# Patient Record
Sex: Female | Born: 1975
Health system: Southern US, Community
[De-identification: ages and names within clinical notes are randomized; demographics above are authoritative.]

## PROBLEM LIST (undated history)

## (undated) DIAGNOSIS — D649 Anemia, unspecified: Secondary | ICD-10-CM

## (undated) DIAGNOSIS — I10 Essential (primary) hypertension: Secondary | ICD-10-CM

## (undated) DIAGNOSIS — M1990 Unspecified osteoarthritis, unspecified site: Secondary | ICD-10-CM

## (undated) DIAGNOSIS — G43909 Migraine, unspecified, not intractable, without status migrainosus: Secondary | ICD-10-CM

## (undated) HISTORY — PX: TONSILLECTOMY: SUR1361

## (undated) HISTORY — PX: DENTAL SURGERY: SHX609

## (undated) HISTORY — PX: BREAST ENHANCEMENT SURGERY: SHX7

## (undated) HISTORY — PX: WISDOM TOOTH EXTRACTION: SHX21

---

## 1999-01-12 ENCOUNTER — Other Ambulatory Visit: Admission: RE | Admit: 1999-01-12 | Discharge: 1999-01-12 | Payer: Self-pay | Admitting: Obstetrics and Gynecology

## 1999-01-15 ENCOUNTER — Other Ambulatory Visit: Admission: RE | Admit: 1999-01-15 | Discharge: 1999-01-15 | Payer: Self-pay | Admitting: Obstetrics

## 2006-04-10 ENCOUNTER — Emergency Department (HOSPITAL_COMMUNITY): Admission: EM | Admit: 2006-04-10 | Discharge: 2006-04-10 | Payer: Self-pay | Admitting: Emergency Medicine

## 2006-04-13 ENCOUNTER — Ambulatory Visit (HOSPITAL_COMMUNITY): Admission: RE | Admit: 2006-04-13 | Discharge: 2006-04-13 | Payer: Self-pay | Admitting: Emergency Medicine

## 2006-07-14 ENCOUNTER — Emergency Department (HOSPITAL_COMMUNITY): Admission: EM | Admit: 2006-07-14 | Discharge: 2006-07-14 | Payer: Self-pay | Admitting: Emergency Medicine

## 2006-07-27 ENCOUNTER — Emergency Department (HOSPITAL_COMMUNITY): Admission: EM | Admit: 2006-07-27 | Discharge: 2006-07-27 | Payer: Self-pay | Admitting: Emergency Medicine

## 2006-11-09 ENCOUNTER — Emergency Department (HOSPITAL_COMMUNITY): Admission: EM | Admit: 2006-11-09 | Discharge: 2006-11-09 | Payer: Self-pay | Admitting: Emergency Medicine

## 2007-02-05 ENCOUNTER — Emergency Department (HOSPITAL_COMMUNITY): Admission: EM | Admit: 2007-02-05 | Discharge: 2007-02-05 | Payer: Self-pay | Admitting: Family Medicine

## 2007-09-13 ENCOUNTER — Emergency Department (HOSPITAL_COMMUNITY): Admission: EM | Admit: 2007-09-13 | Discharge: 2007-09-13 | Payer: Self-pay | Admitting: Emergency Medicine

## 2008-01-09 ENCOUNTER — Emergency Department (HOSPITAL_COMMUNITY): Admission: EM | Admit: 2008-01-09 | Discharge: 2008-01-09 | Payer: Self-pay | Admitting: Family Medicine

## 2008-04-05 ENCOUNTER — Emergency Department (HOSPITAL_BASED_OUTPATIENT_CLINIC_OR_DEPARTMENT_OTHER): Admission: EM | Admit: 2008-04-05 | Discharge: 2008-04-05 | Payer: Self-pay | Admitting: Emergency Medicine

## 2008-05-11 ENCOUNTER — Ambulatory Visit (HOSPITAL_BASED_OUTPATIENT_CLINIC_OR_DEPARTMENT_OTHER): Admission: RE | Admit: 2008-05-11 | Discharge: 2008-05-11 | Payer: Self-pay | Admitting: Obstetrics and Gynecology

## 2008-05-19 ENCOUNTER — Ambulatory Visit (HOSPITAL_BASED_OUTPATIENT_CLINIC_OR_DEPARTMENT_OTHER): Admission: RE | Admit: 2008-05-19 | Discharge: 2008-05-19 | Payer: Self-pay | Admitting: Obstetrics and Gynecology

## 2008-06-02 ENCOUNTER — Encounter (INDEPENDENT_AMBULATORY_CARE_PROVIDER_SITE_OTHER): Payer: Self-pay | Admitting: *Deleted

## 2008-06-08 LAB — CONVERTED CEMR LAB: Pap Smear: NORMAL

## 2008-09-05 ENCOUNTER — Ambulatory Visit: Payer: Self-pay | Admitting: Family Medicine

## 2008-09-05 DIAGNOSIS — L678 Other hair color and hair shaft abnormalities: Secondary | ICD-10-CM | POA: Insufficient documentation

## 2008-09-05 DIAGNOSIS — I10 Essential (primary) hypertension: Secondary | ICD-10-CM | POA: Insufficient documentation

## 2008-09-05 DIAGNOSIS — L738 Other specified follicular disorders: Secondary | ICD-10-CM | POA: Insufficient documentation

## 2008-10-07 ENCOUNTER — Ambulatory Visit: Payer: Self-pay | Admitting: *Deleted

## 2008-10-07 DIAGNOSIS — E7800 Pure hypercholesterolemia, unspecified: Secondary | ICD-10-CM | POA: Insufficient documentation

## 2008-10-07 DIAGNOSIS — D135 Benign neoplasm of extrahepatic bile ducts: Secondary | ICD-10-CM

## 2008-10-07 DIAGNOSIS — K219 Gastro-esophageal reflux disease without esophagitis: Secondary | ICD-10-CM | POA: Insufficient documentation

## 2008-10-07 DIAGNOSIS — M159 Polyosteoarthritis, unspecified: Secondary | ICD-10-CM | POA: Insufficient documentation

## 2008-10-07 DIAGNOSIS — R109 Unspecified abdominal pain: Secondary | ICD-10-CM | POA: Insufficient documentation

## 2008-10-07 DIAGNOSIS — D134 Benign neoplasm of liver: Secondary | ICD-10-CM | POA: Insufficient documentation

## 2008-10-07 DIAGNOSIS — IMO0001 Reserved for inherently not codable concepts without codable children: Secondary | ICD-10-CM | POA: Insufficient documentation

## 2008-10-07 DIAGNOSIS — G43009 Migraine without aura, not intractable, without status migrainosus: Secondary | ICD-10-CM | POA: Insufficient documentation

## 2008-10-07 DIAGNOSIS — K5909 Other constipation: Secondary | ICD-10-CM | POA: Insufficient documentation

## 2008-10-08 ENCOUNTER — Encounter (INDEPENDENT_AMBULATORY_CARE_PROVIDER_SITE_OTHER): Payer: Self-pay | Admitting: *Deleted

## 2008-10-17 ENCOUNTER — Ambulatory Visit: Payer: Self-pay | Admitting: *Deleted

## 2008-10-17 DIAGNOSIS — R17 Unspecified jaundice: Secondary | ICD-10-CM | POA: Insufficient documentation

## 2008-10-17 LAB — CONVERTED CEMR LAB
ALT: 15 units/L (ref 0–35)
AST: 17 units/L (ref 0–37)
Albumin: 4.2 g/dL (ref 3.5–5.2)
Alkaline Phosphatase: 64 units/L (ref 39–117)
Amylase: 111 units/L (ref 27–131)
BUN: 13 mg/dL (ref 6–23)
Basophils Absolute: 0.1 10*3/uL (ref 0.0–0.1)
Basophils Relative: 1.1 % (ref 0.0–3.0)
CO2: 31 meq/L (ref 19–32)
Calcium: 9 mg/dL (ref 8.4–10.5)
Chloride: 101 meq/L (ref 96–112)
Creatinine, Ser: 0.7 mg/dL (ref 0.4–1.2)
Eosinophils Absolute: 0.2 10*3/uL (ref 0.0–0.7)
Eosinophils Relative: 3.6 % (ref 0.0–5.0)
GFR calc Af Amer: 124 mL/min
GFR calc non Af Amer: 102 mL/min
Glucose, Bld: 74 mg/dL (ref 70–99)
HCT: 37.9 % (ref 36.0–46.0)
Hemoglobin: 12.9 g/dL (ref 12.0–15.0)
Lipase: 14 units/L (ref 11.0–59.0)
Lymphocytes Relative: 41.8 % (ref 12.0–46.0)
MCHC: 34 g/dL (ref 30.0–36.0)
MCV: 96 fL (ref 78.0–100.0)
Monocytes Absolute: 0.4 10*3/uL (ref 0.1–1.0)
Monocytes Relative: 7.3 % (ref 3.0–12.0)
Neutro Abs: 2.3 10*3/uL (ref 1.4–7.7)
Neutrophils Relative %: 46.2 % (ref 43.0–77.0)
Platelets: 238 10*3/uL (ref 150–400)
Potassium: 4.1 meq/L (ref 3.5–5.1)
RBC: 3.95 M/uL (ref 3.87–5.11)
RDW: 12 % (ref 11.5–14.6)
Sodium: 138 meq/L (ref 135–145)
TSH: 1.31 microintl units/mL (ref 0.35–5.50)
Total Bilirubin: 1.4 mg/dL — ABNORMAL HIGH (ref 0.3–1.2)
Total Protein: 6.9 g/dL (ref 6.0–8.3)
WBC: 5.1 10*3/uL (ref 4.5–10.5)

## 2008-10-28 ENCOUNTER — Ambulatory Visit: Payer: Self-pay | Admitting: *Deleted

## 2008-10-28 DIAGNOSIS — L02219 Cutaneous abscess of trunk, unspecified: Secondary | ICD-10-CM | POA: Insufficient documentation

## 2008-10-28 DIAGNOSIS — B354 Tinea corporis: Secondary | ICD-10-CM | POA: Insufficient documentation

## 2008-10-28 DIAGNOSIS — L03319 Cellulitis of trunk, unspecified: Secondary | ICD-10-CM

## 2008-11-30 ENCOUNTER — Encounter (INDEPENDENT_AMBULATORY_CARE_PROVIDER_SITE_OTHER): Payer: Self-pay | Admitting: *Deleted

## 2008-12-02 ENCOUNTER — Ambulatory Visit: Payer: Self-pay | Admitting: Diagnostic Radiology

## 2008-12-02 ENCOUNTER — Ambulatory Visit (HOSPITAL_BASED_OUTPATIENT_CLINIC_OR_DEPARTMENT_OTHER): Admission: RE | Admit: 2008-12-02 | Discharge: 2008-12-02 | Payer: Self-pay | Admitting: Gastroenterology

## 2008-12-12 ENCOUNTER — Telehealth (INDEPENDENT_AMBULATORY_CARE_PROVIDER_SITE_OTHER): Payer: Self-pay | Admitting: *Deleted

## 2009-03-23 ENCOUNTER — Ambulatory Visit: Payer: Self-pay | Admitting: Family Medicine

## 2010-05-04 ENCOUNTER — Encounter: Payer: Self-pay | Admitting: Family

## 2010-05-07 ENCOUNTER — Encounter: Payer: Self-pay | Admitting: Family

## 2010-05-07 ENCOUNTER — Telehealth: Payer: Self-pay | Admitting: Family

## 2010-05-09 ENCOUNTER — Ambulatory Visit: Payer: Self-pay | Admitting: Diagnostic Radiology

## 2010-05-09 ENCOUNTER — Ambulatory Visit (HOSPITAL_BASED_OUTPATIENT_CLINIC_OR_DEPARTMENT_OTHER): Admission: RE | Admit: 2010-05-09 | Discharge: 2010-05-09 | Payer: Self-pay | Admitting: Gastroenterology

## 2010-05-09 ENCOUNTER — Encounter: Payer: Self-pay | Admitting: Family

## 2010-05-11 IMAGING — CT CT HEAD W/O CM
1 series · 16 of 30 positions shown, 20 images · non-contrast
Comparison: Head CT 04/13/2006.

CLINICAL DATA: Headache.

CT HEAD WITHOUT CONTRAST
TECHNIQUE: Contiguous axial images were obtained from the base of
the skull through the vertex without contrast.

[Series 2: head 4.8 h37s · axial · 0.40mm/px · z∈[+1246,+1379]mm · 16 of 32 slices shown, 20 images]
[im 2/32  brain]
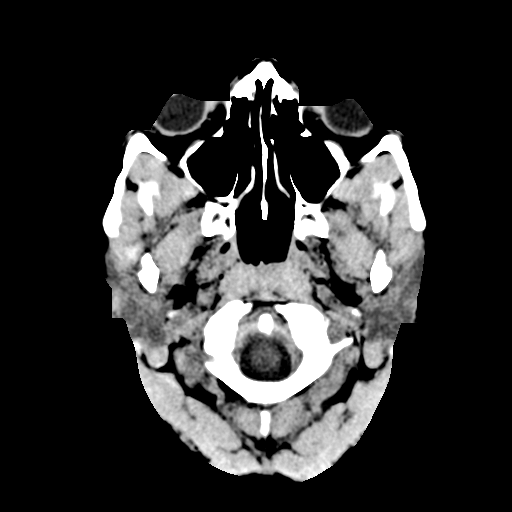
[im 2/32  bone]
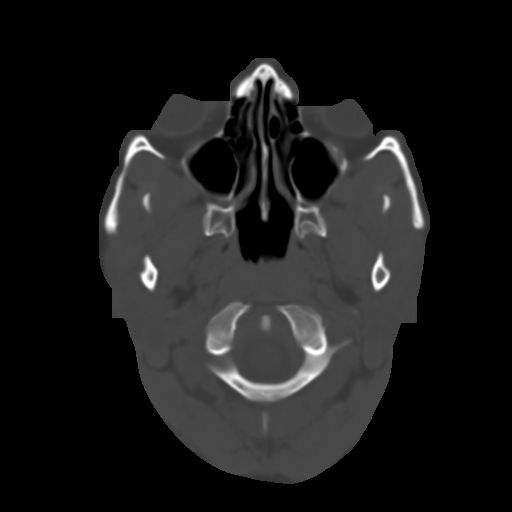
[im 4/32  brain]
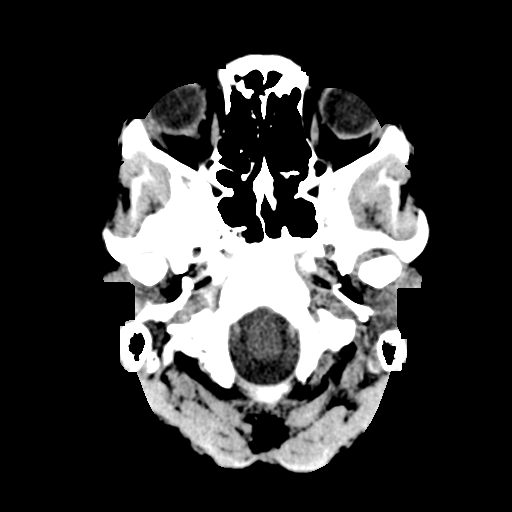
[im 6/32  brain]
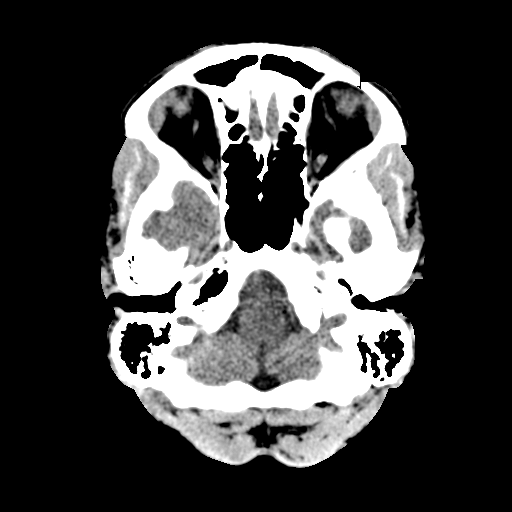
[im 8/32  brain]
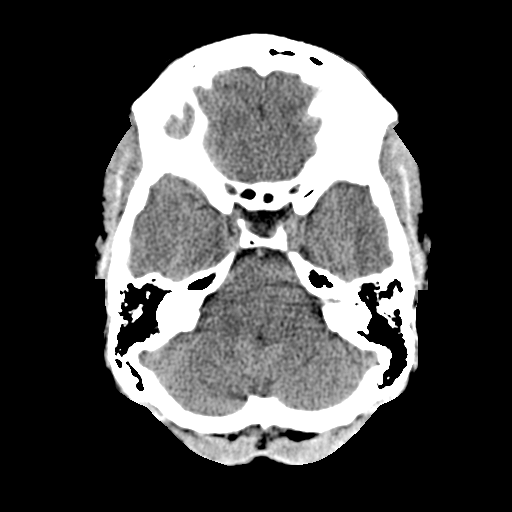
[im 9/32  brain]
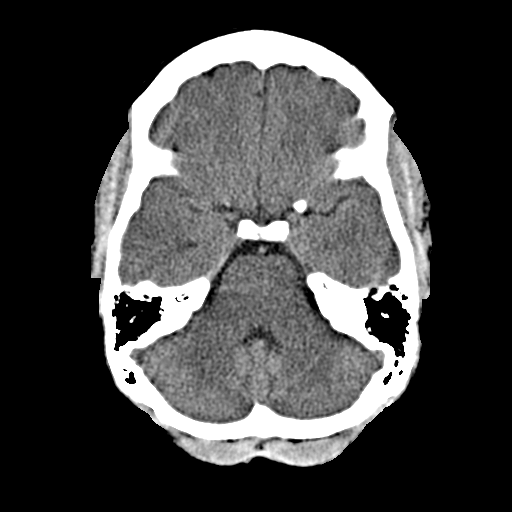
[im 9/32  bone]
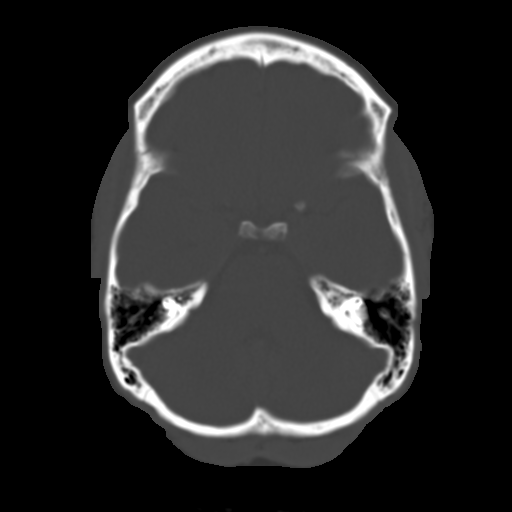
[im 11/32  brain]
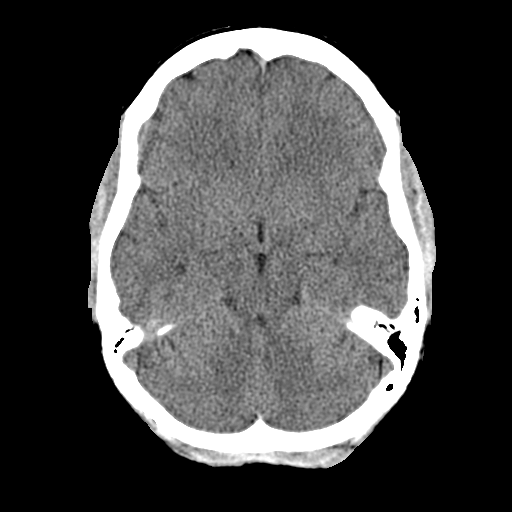
[im 13/32  brain]
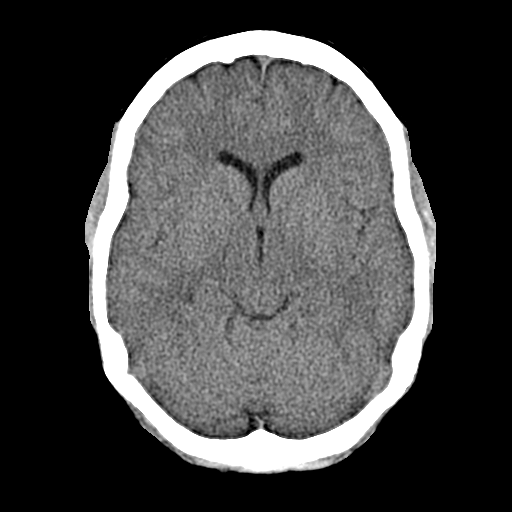
[im 15/32  brain]
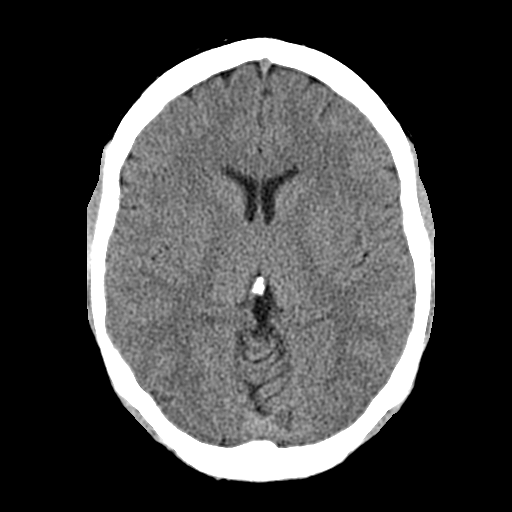
[im 17/32  brain]
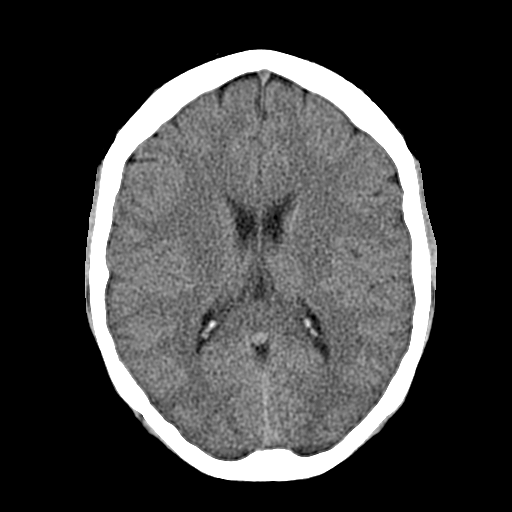
[im 17/32  bone]
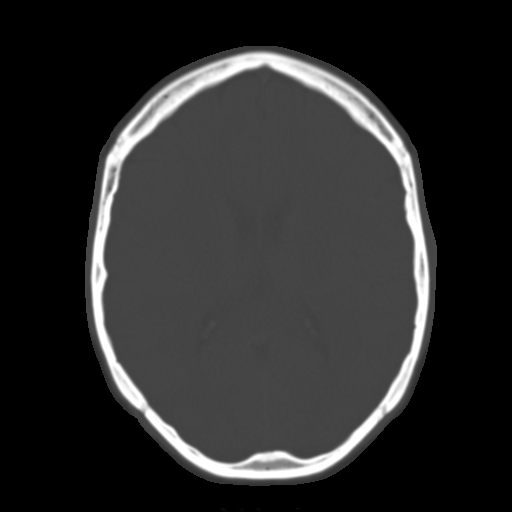
[im 19/32  brain]
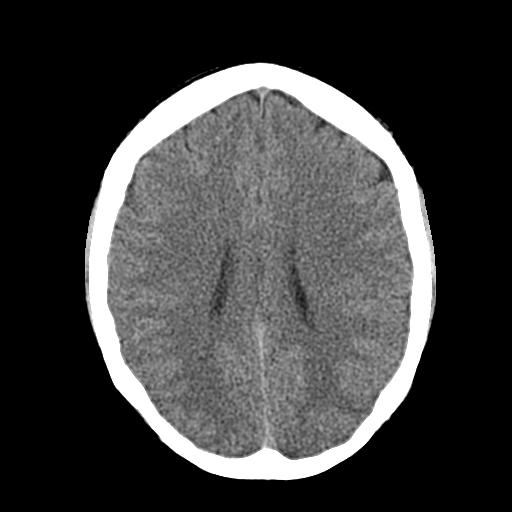
[im 21/32  brain]
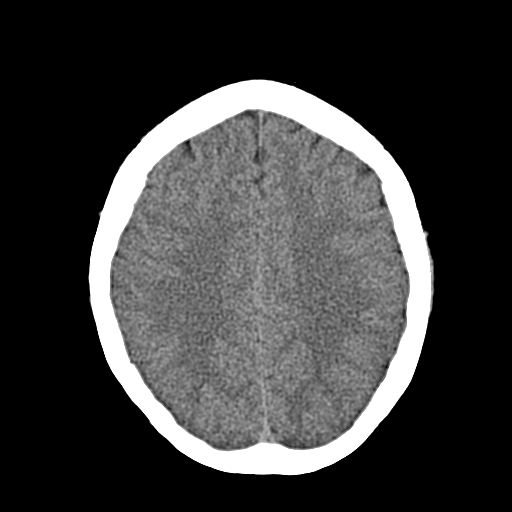
[im 23/32  brain]
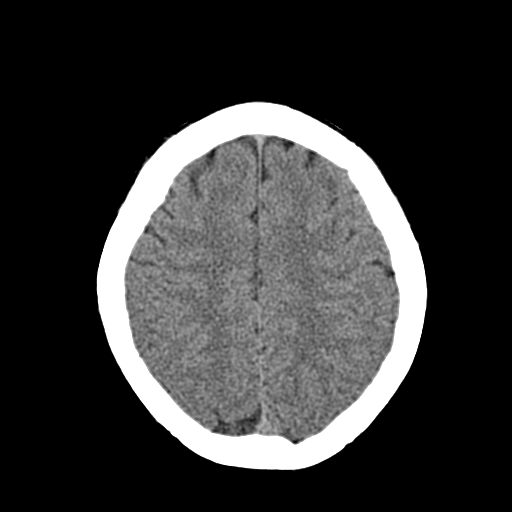
[im 24/32  brain]
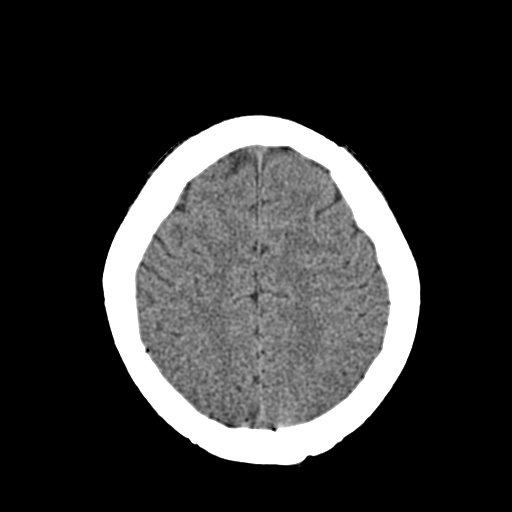
[im 24/32  bone]
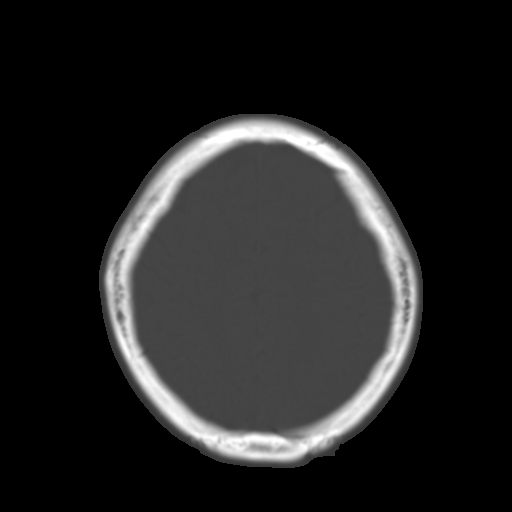
[im 26/32  brain]
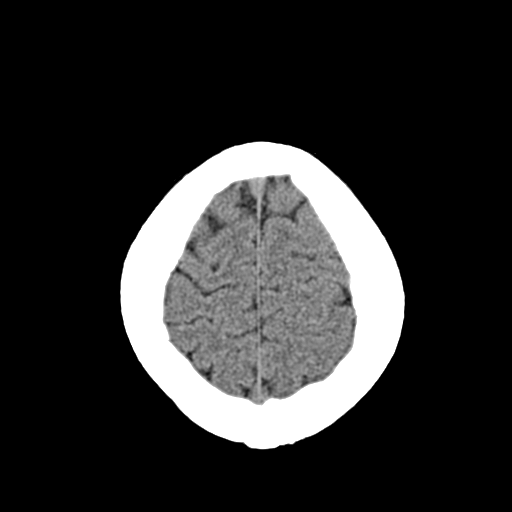
[im 28/32  brain]
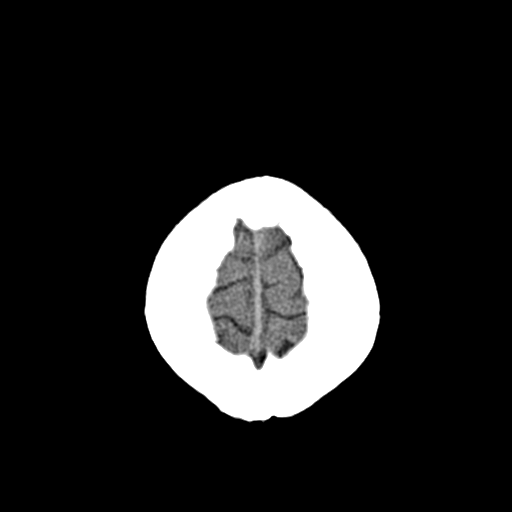
[im 30/32  brain]
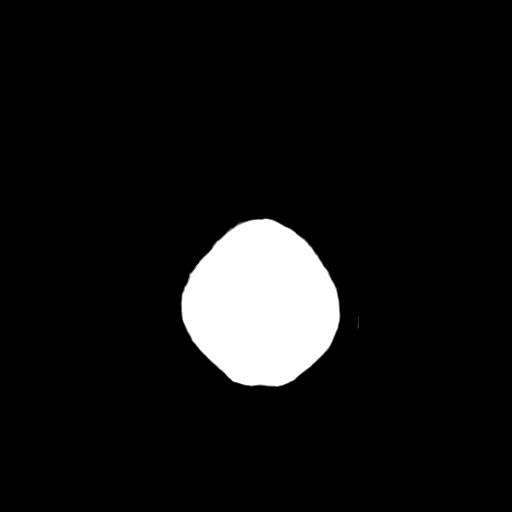

[16 of 30 positions shown; findings below may reference images not displayed]

FINDINGS: The brain appears normal without evidence of hemorrhage,
infarct, mass, mass effect, midline shift or abnormal extra-axial
fluid collection.  There is no hydrocephalus.  Imaged paranasal
sinuses demonstrate partial visualization of a polyp or cyst in the
left maxillary sinus.  Paranasal sinuses and mastoid air cells are
otherwise clear.  Calvarium is intact.
IMPRESSION: 1.  No acute intracranial abnormality.
2.  Partial visualization of a polyp or mucous retention cyst in
the left maxillary sinus.

## 2010-07-29 ENCOUNTER — Ambulatory Visit: Payer: Self-pay | Admitting: Emergency Medicine

## 2010-10-01 ENCOUNTER — Encounter: Payer: Self-pay | Admitting: Obstetrics and Gynecology

## 2010-10-11 NOTE — Letter (Signed)
Summary: [facility]  [facility]   Imported By: Lanelle Bal 05/11/2010 11:56:06  _____________________________________________________________________  External Attachment:    Type:   Image     Comment:   External Document

## 2010-10-11 NOTE — Progress Notes (Signed)
Summary: Needs appt., letter mailed  Phone Note Outgoing Call   Summary of Call: Please call patient and let her know that I reviewed consultation from Dr. Matthias Hughs.  She is due for follow up in our office.  Initial call taken by: Lemont Fillers FNP,  May 07, 2010 10:11 AM  Follow-up for Phone Call        Pt home number no longer in service, not listed at work number.  Mailed contact letter to pt to call and schedule an appt.  Pt will need . appt. Nicki Guadalajara Fergerson CMA Duncan Dull)  May 07, 2010 12:11 PM

## 2010-10-11 NOTE — Assessment & Plan Note (Signed)
Summary: SORE THROAT & SINUS/KH   Vital Signs:  Patient Profile:   35 Years Old Female CC:      Sore throat, sinus drainage, sneezing x 1 day Height:     61.5 inches Weight:      145 pounds O2 Sat:      98 % O2 treatment:    Room Air Temp:     98 degrees F oral Pulse rate:   60 / minute Pulse rhythm:   regular Resp:     12 per minute BP sitting:   117 / 78  (right arm) Cuff size:   regular                  Current Allergies: ! * METRONIDAZOLE ! FLAGYL History of Present Illness Chief Complaint: Sore throat, sinus drainage, sneezing x 1 day History of Present Illness: ONSET TODAY WITH SCRATCHY THROAT. NO FEVER. NO EAR PAIN. HAS SINUS PRESSURE. STATES IT IS COMING ON FAST.  Current Meds NADOLOL 40 MG TABS (NADOLOL) one a day * VITAMIN A 10,000 once a day * MULTIVITAMIN once a day * VITAMIN C 500  MG once a day SUMATRIPTAN SUCCINATE 100 MG TABS (SUMATRIPTAN SUCCINATE) 1 tab at onset, may repeat x 1 if no relief in 24 hours, max of 2 / 24hours * AMOXICILLIN 1 TAB three times a day * RONDEC DM 1-2 TSP by mouth Q 6 HRS as needed COUGH AND CONGESTION  REVIEW OF SYSTEMS Constitutional Symptoms      Denies fever, chills, night sweats, weight loss, weight gain, and fatigue.  Eyes       Denies change in vision, eye pain, eye discharge, glasses, contact lenses, and eye surgery. Ear/Nose/Throat/Mouth       Complains of sinus problems and sore throat.      Denies hearing loss/aids, change in hearing, ear pain, ear discharge, dizziness, frequent runny nose, frequent nose bleeds, hoarseness, and tooth pain or bleeding.  Respiratory       Denies dry cough, productive cough, wheezing, shortness of breath, asthma, bronchitis, and emphysema/COPD.  Cardiovascular       Denies murmurs, chest pain, and tires easily with exhertion.    Gastrointestinal       Denies stomach pain, nausea/vomiting, diarrhea, constipation, blood in bowel movements, and indigestion. Genitourniary       Denies  painful urination, kidney stones, and loss of urinary control. Neurological       Denies paralysis, seizures, and fainting/blackouts. Musculoskeletal       Denies muscle pain, joint pain, joint stiffness, decreased range of motion, redness, swelling, muscle weakness, and gout.  Skin       Denies bruising, unusual mles/lumps or sores, and hair/skin or nail changes.  Psych       Denies mood changes, temper/anger issues, anxiety/stress, speech problems, depression, and sleep problems.  Past History:  Past Medical History: Reviewed history from 10/17/2008 and no changes required. arthritis migraines - neurologist fibromyalgia (took lyrica for one month, but no further symptoms after that) Hypertension (and white coat HTN - BP better when checked outside of office) GERD chronic, intermittent constipation benign lesion - liver chronic, intermittent abdominal pain since 04/2008 hyperlipidemia gilbert's syndrome  Past Surgical History: Reviewed history from 09/05/2008 and no changes required. tonsillectomy 2/98 wisdom teeth removed 3/03  Family History: Reviewed history from 09/05/2008 and no changes required. mother, father, 1 brother alive and healthy  Social History: Reviewed history from 09/05/2008 and no changes required. Never Smoked Alcohol use-no  Drug use-no Regular exercise-no Physical Exam General appearance: well developed, well nourished, no acute distress Eyes: conjunctivae and lids normal Ears: normal, no lesions or deformities Nasal: CONGESTION, SUINUS PRESSURE Oral/Pharynx: UVULA AND SOFT PALAT SWOLLEN AND RED. Neck: neck supple,  trachea midline, no masses Chest/Lungs: no rales, wheezes, or rhonchi bilateral, breath sounds equal without effort Heart: regular rate and  rhythm, no murmur Skin: no obvious rashes or lesions Assessment New Problems: UPPER RESPIRATORY INFECTION, ACUTE (ICD-465.9)   Plan New Medications/Changes: RONDEC DM 1-2 TSP by mouth Q 6  HRS as needed COUGH AND CONGESTION  #4 OZ x 0, 03/23/2009, [name] DO AMOXICILLIN 1 TAB three times a day  #30 x 0, 03/23/2009, [name] DO  New Orders: Est. Patient Level II [81191]   Prescriptions: RONDEC DM 1-2 TSP by mouth Q 6 HRS as needed COUGH AND CONGESTION  #4 OZ x 0   Entered and Authorized by:   [name] DO   Signed by:   [name] DO on 03/23/2009   Method used:   Print then Give to Patient   RxID:   [id number] AMOXICILLIN 1 TAB three times a day  #30 x 0   Entered and Authorized by:   [name] DO   Signed by:   [name] DO on 03/23/2009   Method used:   Print then Give to Patient   RxID:   [id number]   Patient Instructions: 1)  TYLENOL OR MOTRIN AS NEEDED. AVOID CAFFEINE AND MILK PRODUCTS. FOLLOW UP WITH PCP IF SYMPTOMS FAIN TO RESOLVE ORE WORSEN.

## 2010-10-11 NOTE — Assessment & Plan Note (Signed)
Summary: NEW PT TO EST/HEA   Vital Signs:  Patient Profile:   35 Years Old Female LMP:     09/27/2008 Height:     61.5 inches Weight:      145 pounds BMI:     27.05 O2 treatment:    Room Air Temp:     98.3 degrees F oral Pulse rate:   68 / minute Pulse rhythm:   regular Resp:     18 per minute BP sitting:   120 / 90  (right arm) Cuff size:   regular  Vitals Entered By: Darra Lis RMA (October 07, 2008 10:39 AM)  Menstrual History: LMP (date): 09/27/2008 LMP - Character: normal Menarche: 11 Menses interval: 28 days Menstrual flow: 4-5 days             Is Patient Diabetic? No     Visit Type:  New Patient - PE PCP:  Paulo Fruit MD  Chief Complaint:  Establish care with new doctor.Marland Kitchen  History of Present Illness: Patient would like a complete physical, labs and referral to GI MD  patient with a history of chronic, intermittent abdominal pain since 05/2008 - she has been diagnosis with GERD - now controlled with behavioral changes - she has a history of chronic, intermittent constipaiton - doing well at current time - she has a history of some blood in the stool several months ago - but was told it was her hemorrhoids - also with a history of a "benign lesion" found in her liver on MRI - those studies / records not available today.  Patient does not have any abdominal pain today, but would like to see GI to get to the bottom of these intermittent symptoms.   Patient would also like an HIV test done today.  She had full STD testing done in 05/2008 with gyn that was all negative, but would like the HIV repeated.  patient is married . . . but is concerned she might have been exposed.  full discussion given.  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  Patient with a history of migraines - has seen neuro in the past - no history of increased  intensity / frequency.  needs refill on imitrex.    Updated Prior Medication List: NADOLOL 40 MG TABS (NADOLOL) one a day * VITAMIN A 10,000 once a day * MULTIVITAMIN once a day * VITAMIN C 500  MG once a day SUMATRIPTAN SUCCINATE 100 MG TABS (SUMATRIPTAN SUCCINATE) 1 tab at onset, may repeat x 1 if no relief in 24 hours, max of 2 / 24hours  Current Allergies (reviewed today): ! * METRONIDAZOLE ! FLAGYL  Past Medical History:    arthritis    migraines - neurologist    fibromyalgia (took lyrica for one month, but no further symptoms after that)    Hypertension    GERD    chronic, intermittent constipation    benign lesion - liver    chronic, intermittent abdominal pain since 04/2008    hyperlipidemia    Risk Factors:  Passive smoke exposure:  yes Caffeine use:  3 - patient advised to cut back drinks per day Alcohol use:  yes    Type:  wine    Has patient --       Felt need to cut down:  no       Been annoyed  by complaints:  no       Felt guilty about drinking:  no       Needed eye opener in the morning:  no    Comments:  2 to 3 times a year    Counseled to quit/cut down alcohol use:  no Seatbelt use:  100 % Sun Exposure:  rarely  Family History Risk Factors:    Family History of MI in females < 68 years old:  no    Family History of MI in males < 43 years old:  no  PAP Smear History:     Date of Last PAP Smear:  06/08/2008    Results:  Normal   Mammogram History:     Date of Last Mammogram:  09/04/2006    Results:  Normal Bilateral    Review of Systems  The patient denies anorexia, fever, weight loss, weight gain, vision loss, decreased hearing, hoarseness, chest pain, syncope, dyspnea on exertion, peripheral edema, prolonged cough, hemoptysis, hematuria, genital sores, muscle weakness, suspicious skin lesions, transient blindness, difficulty walking, depression, unusual weight change, abnormal bleeding, enlarged lymph nodes, angioedema, and breast masses.      Physical Exam  General:     Well-developed, well nourished, well hydrated, in no acute distress; appropriate and cooperative   Head:     Normocephalic and atraumatic without obvious abnormalities.  Eyes:     No corneal or conjunctival inflammation. EOMI. PERRLA. Funduscopic exam benign, Vision grossly normal. Ears:     External ear shows no significant lesions or deformities.  Otoscopic examination reveals clear canals, tympanic membranes normal bilaterally. Hearing is grossly normal. Nose:     External nasal examination shows no deformity or inflammation. Nasal mucosa are pink and moist without lesions or exudates. Mouth:     Oral mucosa and oropharynx without lesions, erythema or exudates.  Teeth in good repair. no postnasal drip and no tongue abnormalities.   Neck:     supple, full ROM Chest Wall:     No deformities, masses, or tenderness noted. Lungs:     Normal respiratory effort, chest expands symmetrically with good air flow noted. Lungs clear to auscultation with no crackles, rales or wheezes.   Heart:     Normal rate and  rhythm. S1 and S2 normal, without gallop, murmur, click, rub  Abdomen:     Bowel sounds positive, abdomen soft and non-tender without masses, organomegaly or hernias noted. no distention  Msk:     No gross deformity noted of cervical, thoracic or lumbar spine. normal ROM. no muscle atrophy noted.   Extremities:     No clubbing, cyanosis, edema, or deformity noted, grossly normal full range of motion with all joints.   Neurologic:     alert & oriented X3,  gait normal.  no deficit in strength noted, no balance problems noted, neuro is grossly intact Skin:     Intact without suspicious lesions or rashes Psych:     Cognition and judgment appear intact. Alert and cooperative with normal attention span and concentration. No apparent delusions, illusions, hallucinations, normally interactive, good eye contact, not anxious or depressed appearing, and not  agitated.        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  will recheck BP at that appointment.  get labs Her updated  medication list for this problem includes:    Nadolol 40 Mg Tabs (Nadolol) ..... One a day  Orders: TLB-CMP (Comprehensive Metabolic Pnl) (80053-COMP) TLB-CBC Platelet - w/Differential (85025-CBCD) TLB-TSH (Thyroid Stimulating Hormone) (84443-TSH)   Problem # 2:  ABDOMINAL PAIN, CHRONIC (ICD-789.00) No pain today.  As per HPI, will get referral.  also some labs. Orders: Gastroenterology Referral (GI) TLB-Amylase (82150-AMYL) TLB-Lipase (83690-LIPASE)   Problem # 3:  GERD (ICD-530.81) patient has controlled the symptoms with behavioral / dietary changes per her report.  she will continue same and GI referral to review all GI diagnosis and intermittent abdominal pain Orders: Gastroenterology Referral (GI)   Problem # 4:  CONSTIPATION, CHRONIC (ICD-564.09) patient has controlled the symptoms with otc meds, behavioral / dietary changes per her report.  she will continue same and GI referral to review all GI diagnosis and intermittent abdominal pain Orders: Gastroenterology Referral (GI) TLB-TSH (Thyroid Stimulating Hormone) (84443-TSH)   Problem # 5:  COMMON MIGRAINE (ICD-346.10) will continue current meds Her updated medication list for this problem includes:    Nadolol 40 Mg Tabs (Nadolol) ..... One a day    Sumatriptan Succinate 100 Mg Tabs (Sumatriptan succinate) .Marland Kitchen... 1 tab at onset, may repeat x 1 if no relief in 24 hours, max of 2 / 24hours  Orders: TLB-CMP (Comprehensive Metabolic Pnl) (80053-COMP) TLB-CBC Platelet - w/Differential (85025-CBCD) TLB-TSH (Thyroid Stimulating Hormone) (84443-TSH)   Problem # 6:  CONTACT WITH OR EXPOSURE TO VENEREAL DISEASES  (ICD-V01.6) patient requests an HIV test.  She had full STD testing in 05/2008 when she was seen by gyn - all negative, but would like to have HIV repeated today.  patient is aware that these results cannot be given over the phone and she will return in one week to review results Orders: T-HIV Antibody  (Reflex) ([id number])   Problem # 7:  ENCOUNTER FOR LONG-TERM USE OF OTHER MEDICATIONS (ICD-V58.69)  Orders: TLB-CMP (Comprehensive Metabolic Pnl) (80053-COMP) TLB-CBC Platelet - w/Differential (85025-CBCD) TLB-TSH (Thyroid Stimulating Hormone) (84443-TSH) TLB-Amylase (82150-AMYL) TLB-Lipase (83690-LIPASE)   Complete Medication List: 1)  Nadolol 40 Mg Tabs (Nadolol) .... One a day 2)  Vitamin A 10,000  .... Once a day 3)  Multivitamin  .... Once a day 4)  Vitamin C 500 Mg  .... Once a day 5)  Sumatriptan Succinate 100 Mg Tabs (Sumatriptan succinate) .Marland Kitchen.. 1 tab at onset, may repeat x 1 if no relief in 24 hours, max of 2 / 24hours   Patient Instructions: 1)  Please schedule a follow-up appointment on 10/17/08 (or that week)   Prescriptions: SUMATRIPTAN SUCCINATE 100 MG TABS (SUMATRIPTAN SUCCINATE) 1 tab at onset, may repeat x 1 if no relief in 24 hours, max of 2 / 24hours  #12 x 1   Entered and Authorized by:   Paulo Fruit MD   Signed by:   Paulo Fruit MD on 10/07/2008   Method used:   Electronically to        [facility]* (retail)       [address]       [address] Shipping/mailing       [place], [place]  [address]       Ph: [phone]       Fax: [phone]   RxID:   [id number]   Appended Document: NEW PT TO EST/HEA charges added   Clinical Lists Changes  Orders: Added new Service order of New Patient Level IV (51884) - Signed

## 2010-10-11 NOTE — Consult Note (Signed)
Summary: [facility]  [facility]   Imported By: Lanelle Bal 12/07/2008 13:33:17  _____________________________________________________________________  External Attachment:    Type:   Image     Comment:   External Document

## 2010-10-11 NOTE — Letter (Signed)
Summary: [facility]  [facility]   Imported By: Lanelle Bal 10/20/2008 08:59:10  _____________________________________________________________________  External Attachment:    Type:   Image     Comment:   External Document

## 2010-10-11 NOTE — Assessment & Plan Note (Signed)
Summary: wound that is not healing   Vital Signs:  Patient Profile:   35 Years Old Female Height:     61.5 inches Weight:      145 pounds BMI:     27.05 O2 treatment:    Room Air Temp:     97.9 degrees F oral Pulse rate:   64 / minute Pulse rhythm:   regular Resp:     18 per minute BP sitting:   102 / 68  (right arm) Cuff size:   regular  Vitals Entered By: Darra Lis RMA (October 28, 2008 3:42 PM)                 Visit Type:  acute PCP:  Paulo Fruit MD  Chief Complaint:  area on left thigh that won't heal.  History of Present Illness: The patient reports that 2 months ago, she accidentally shaved off a "hair bump" in the left inguinal area that became infected.  She went to urgent care, was diagnosis with folliculitis and  was put on doxycyline and told to wash with benzoyl peroxide.  The area improved a lot, but never seemed to totally resolve.  She describes intermittent flares in the area where it will get tender, discolored and have some discharge.  There has never been a boil / abcess type lesion per patient report.  She just feels that it has never completely resolved.  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  Patient with a history of migraines - has seen neuro in the past - no history of increased intensity / frequency.  has follow up with neuro on 11/10/2008.  past history of borderline hyperlipidemia, never been on meds - had levels checked a few months ago - records not available - has follow up scheduled with me in June to recheck labs.  Patient with a history of  chronic, intermittent abdominal pain - was given a referral to GI at last appointment - that appointment is scheduled for 11/30/08.  no pain reported today.    Updated Prior Medication List: NADOLOL 40 MG TABS (NADOLOL) one a day * VITAMIN A 10,000 once a  day * MULTIVITAMIN once a day * VITAMIN C 500  MG once a day SUMATRIPTAN SUCCINATE 100 MG TABS (SUMATRIPTAN SUCCINATE) 1 tab at onset, may repeat x 1 if no relief in 24 hours, max of 2 / 24hours Current Allergies (reviewed today): ! * METRONIDAZOLE ! FLAGYL  Past Medical History:    Reviewed history from 10/17/2008 and no changes required:       arthritis       migraines - neurologist       fibromyalgia (took lyrica for one month, but no further symptoms after that)       Hypertension (and white coat HTN - BP better when checked outside of office)       GERD       chronic, intermittent constipation       benign lesion - liver       chronic, intermittent abdominal pain since 04/2008       hyperlipidemia       gilbert's syndrome  Past Surgical History:    Reviewed history from 09/05/2008 and no changes required:       tonsillectomy 2/98       wisdom teeth removed 3/03  Review of Systems       no nausea / vomiting / diarrhea / fever / chills / chest pain / SOB    Physical Exam  General:     Well-developed, well nourished, well hydrated, in no acute distress; appropriate and cooperative   Head:     Normocephalic and atraumatic without obvious abnormalities.  Eyes:     No corneal or conjunctival inflammation. EOMI. PERRLA. Vision grossly normal. Ears:     External ear shows no significant lesions or deformities.   Hearing is grossly normal. Nose:     External nasal examination shows no deformity or inflammation.   Neck:     supple, full ROM Lungs:     Normal respiratory effort, chest expands symmetrically with good air flow noted. Lungs clear to auscultation with no crackles, rales or wheezes.   Heart:     Normal rate and  rhythm. S1 and S2 normal, without gallop, murmur, click, rub  Extremities:     No clubbing, cyanosis, edema, or deformity noted, grossly normal full range of motion with all joints.   Neurologic:     alert & oriented X3,  gait normal.  no deficit  in strength noted, no balance problems noted, neuro is grossly intact Skin:     General: Intact without suspicious lesions or rashes anywhere except -  Specific exam of the left inguinal area shows a central area of erythema and excoriation with some drainage present about the size of a quarter - no fluctuance noted, no significant increase in warmth.  Surrounding this central area is an area of clearing, then a surrounding - annular scaling rash with a few scattered satellite lesions consistent with a fungal infection. Psych:     Cognition and judgment appear intact. Alert and cooperative with normal attention span and concentration. No apparent delusions, illusions, hallucinations, normally interactive, good eye contact, not anxious or depressed appearing, and not agitated.        Impression & Recommendations:  Problem # 1:  CELLULITIS, GROIN, LEFT (ICD-682.2) will treat with Bactrim to cover for possible MRSA.  Area also has some features consistent with fungal infection.  patient to stop shaving in the area.  she is to use clinical wash twice daily, air the area out and use antifungal cream.  Patient to call if symptoms increase / change / persist or any concerns.  Her updated medication list for this problem includes:    Sulfamethoxazole-tmp Ds 800-160 Mg Tabs (Sulfamethoxazole-trimethoprim) .Marland Kitchen... 1 tab by mouth two times a day x 10 days   Problem # 2:  DERMATOPHYTOSIS OF THE BODY (ICD-110.5) as above.  Problem # 3:  HYPERTENSION (ICD-401.9) continue current meds Her updated medication list for this problem includes:    Nadolol 40 Mg Tabs (Nadolol) ..... One a day   Problem # 4:  HYPERCHOLESTEROLEMIA (ICD-272.0) patient trying diet / exercise and will follow up with me in June - will get labs at that time  Problem # 5:  ABDOMINAL PAIN, CHRONIC (ICD-789.00) patient has appointment with GI MD next month.  no current complaints today.  Reviewed "red flag" symptoms that would require  immediate medical attention. Patient to call if symptoms increase / change / persist or any concerns.   Complete Medication List: 1)  Nadolol 40 Mg Tabs (Nadolol) .... One a day 2)  Vitamin A 10,000  .... Once a day 3)  Multivitamin  .... Once a day 4)  Vitamin C 500 Mg  .... Once a  day 5)  Sumatriptan Succinate 100 Mg Tabs (Sumatriptan succinate) .Marland Kitchen.. 1 tab at onset, may repeat x 1 if no relief in 24 hours, max of 2 / 24hours 6)  Nystatin-triamcinolone 100000-0.1 Unit/gm-% Crea (Nystatin-triamcinolone) .... Apply two times a day as needed until resolved, but no more than 2 weeks at one time 7)  Sulfamethoxazole-tmp Ds 800-160 Mg Tabs (Sulfamethoxazole-trimethoprim) .Marland Kitchen.. 1 tab by mouth two times a day x 10 days   Patient Instructions: 1)  patient has follow up with me in June with MD in the am / fasting.  NOT just a lab appt.    Prescriptions: SULFAMETHOXAZOLE-TMP DS 800-160 MG TABS (SULFAMETHOXAZOLE-TRIMETHOPRIM) 1 tab by mouth two times a day x 10 days  #20 x 1   Entered and Authorized by:   Paulo Fruit MD   Signed by:   Paulo Fruit MD on 10/28/2008   Method used:   Electronically to        [facility] # [phone]* (retail)       [address] [place], [place]  [address]       Ph: [phone]       Fax: [phone]   RxID:   [id number] NYSTATIN-TRIAMCINOLONE 100000-0.1 UNIT/GM-% CREA (NYSTATIN-TRIAMCINOLONE) apply two times a day as needed until resolved, but no more than 2 weeks at one time  #15 gm x 0   Entered and Authorized by:   Paulo Fruit MD   Signed by:   Paulo Fruit MD on 10/28/2008   Method used:   Electronically to        [facility] # [phone]* (retail)       [address] [place], [place]  [address]       Ph: [phone]       Fax: [phone]   RxID:   [id number]

## 2010-10-11 NOTE — Progress Notes (Signed)
Summary: Phone note  Phone Note Call from Patient   Caller: Patient Reason for Call: Talk to Doctor, Lab or Test Results Summary of Call: Day time [phone] or evening time [phone]. Pt. said that she had a ultra sound done 2 weeks ago and has not heard anything about results and would like for someone to call her and let her know something. Thank you, Michaelle Copas Initial call taken by: Michaelle Copas,  December 12, 2008 1:55 PM  Follow-up for Phone Call        Patient calling for U/S results. I do not see any results for this patient. Patient will call the doctor she had the U/S and get results. Follow-up by: Darra Lis RMA,  December 12, 2008 2:06 PM

## 2010-10-11 NOTE — Letter (Signed)
Summary: Out of Work  [facility]  [address]   [place], [place] [address]   Phone: [phone]  Fax: [phone]    March 23, 2009   Employee:  Leah Reid    To Whom It May Concern:   For Medical reasons, please excuse the above named employee from work for the following dates:  Start:  March 23, 2009 5:31 PM   End:  March 25, 2009  If you need additional information, please feel free to contact our office.         Sincerely,    [name] DO

## 2010-10-11 NOTE — Assessment & Plan Note (Signed)
Summary: GET LAB RESULTS-CH   Vital Signs:  Patient Profile:   35 Years Old Female Height:     61.5 inches Weight:      147 pounds BMI:     27.42 O2 treatment:    Room Air Temp:     98.0 degrees F oral Pulse rate:   68 / minute Pulse rhythm:   regular Resp:     18 per minute BP sitting:   130 / 88  (right arm) Cuff size:   regular  Vitals Entered By: Darra Lis RMA (October 17, 2008 1:33 PM)                 Visit Type:  follow up PCP:  Paulo Fruit MD  Chief Complaint:  test results.  History of Present Illness:   At the last appt, the patient gave a history of chronic, intermittent abdominal pain since 05/2008 - she has been diagnosis with GERD - now controlled with behavioral changes - she has a history of chronic, intermittent constipaiton - doing well at current time - she has a history of some blood in the stool several months ago - but was told it was her hemorrhoids - also with a history of a "benign lesion" found in her liver on MRI.  patient requested a GI referral to review all GI issues and get to the bottom of these intermittent symptoms.  Referral was given and appointment made for 11/30/08 - no current abdominal pain today.  Also, at last appt, the Patient requested an HIV test done. She had full STD testing done in 05/2008 with gyn that was all negative, but wanted the HIV repeated.  patient is married . . . but was concerned she might have been exposed.  Results given today - negative.  full discussion given.  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  Patient with a history of migraines - has seen neuro in the past - no history of increased intensity / frequency.  has follow up with neuro on 11/10/2008.  past history of borderline hyperlipidemia, never been on meds - had levels checked a few months ago - records not  availableReviewed all labs done at last appointment:  Tests: (1) CBC WITH DIFF (CBCD)   WHITE CELL COUNT          5.1 K/uL                    4.5-10.5   RED CELL COUNT            3.95 Mil/uL                 3.87-5.11   HEMOGLOBIN                12.9 g/dL                   95.2-84.1   HEMATOCRIT                37.9 %                      36.0-46.0   MCV                       96.0 fl  78.0-100.0   MCHC                      34.0 g/dL                   36.6-44.0   RDW                       12.0 %                      11.5-14.6   PLATELET COUNT            238 K/uL                    150-400  Tests: (2) COMPREHENSIVE PANEL (COMP)   SODIUM                    138 mEq/L                   135-145   POTASSIUM                 4.1 mEq/L                   3.5-5.1   CHLORIDE                  101 mEq/L                   96-112   CARBON DIOXIDE            31 mEq/L                    19-32   GLUCOSE                   74 mg/dL                    34-74   BUN                       13 mg/dL                    2-59   CREATININE                0.7 mg/dL                   5.6-3.8   TOTAL BILIRUBIN      [H]  1.4 mg/dL                   7.5-6.4   ALKALINE PHOSPHATASE      64 U/L                      39-117   SGOT (AST)                17 U/L                      0-37   SGPT (ALT)                15 U/L                      0-35  TOTAL PROTEIN             6.9 g/dL  6.0-8.3   ALBUMIN                   4.2 g/dL                    0.4-5.4   CALCIUM                   9.0 mg/dL                   0.9-81.1  Tests: (3) AMYLASE (AMYL)   AMYLASE                   111 U/L                     27-131  Tests: (4) LIPASE (LIPASE)   LIPASE                    14.0 U/L                    11.0-59.0  Tests: (5) FastTSH (TSH)   FastTSH                   1.31 uIU/mL                 0.35-5.50  All looks good except for slight elevation in total bilirubin.  Patient confirms that this is a chronic  finding for her and that she has been diagnosis with Gilbert's syndrome in the past.    Current Allergies (reviewed today): ! * METRONIDAZOLE ! FLAGYL  Past Medical History:    arthritis    migraines - neurologist    fibromyalgia (took lyrica for one month, but no further symptoms after that)    Hypertension (and white coat HTN - BP better when checked outside of office)    GERD    chronic, intermittent constipation    benign lesion - liver    chronic, intermittent abdominal pain since 04/2008    hyperlipidemia    gilbert's syndrome  Past Surgical History:    Reviewed history from 09/05/2008 and no changes required:       tonsillectomy 2/98       wisdom teeth removed 3/03     Review of Systems       no nausea / vomiting / diarrhea / fever / chills / chest pain / SOB    Physical Exam  General:     Well-developed, well nourished, well hydrated, in no acute distress; appropriate and cooperative   Head:     Normocephalic and atraumatic without obvious abnormalities.  Eyes:     No corneal or conjunctival inflammation. EOMI. PERRLA. Vision grossly normal. Ears:     External ear shows no significant lesions or deformities.   Hearing is grossly normal. Nose:     External nasal examination shows no deformity or inflammation.  Mouth:     Oral mucosa and oropharynx without lesions, erythema or exudates.  Teeth in good repair. no postnasal drip and no tongue abnormalities.   Neck:     supple, full ROM Lungs:     Normal respiratory effort, chest expands symmetrically with good air flow noted. Lungs clear to auscultation with no crackles, rales or wheezes.   Heart:     Normal rate and  rhythm. S1 and S2 normal, without gallop, murmur, click, rub  Extremities:     No clubbing, cyanosis, edema, or deformity noted, grossly normal  full range of motion with all joints.   Neurologic:     alert & oriented X3,  gait normal.  no deficit in strength noted, no balance problems noted,  neuro is grossly intact Skin:     Intact without suspicious lesions or rashes Psych:     Cognition and judgment appear intact. Alert and cooperative with normal attention span and concentration. No apparent delusions, illusions, hallucinations, normally interactive, good eye contact, not anxious or depressed appearing, and not agitated.        Impression & Recommendations:  Problem # 1:  HYPERTENSION (ICD-401.9) she will continue with current meds Her updated medication list for this problem includes:    Nadolol 40 Mg Tabs (Nadolol) ..... One a day   Problem # 2:  COMMON MIGRAINE (ICD-346.10) patient is seeing her neurologist on 11/20/08.  she takes Nadolol for both migraines and HTN.  If meds are adjusted by neuro, she is to follow up with me in one month from change. Her updated medication list for this problem includes:    Nadolol 40 Mg Tabs (Nadolol) ..... One a day    Sumatriptan Succinate 100 Mg Tabs (Sumatriptan succinate) .Marland Kitchen... 1 tab at onset, may repeat x 1 if no relief in 24 hours, max of 2 / 24hours   Problem # 3:  GILBERT'S SYNDROME (ICD-277.4) patient with known history of this diagnosis - no further work up needed on elevated bilirubin - will follow periodically  Problem # 4:  CONTACT WITH OR EXPOSURE TO VENEREAL DISEASES (ICD-V01.6) reviewed HIV results as negative - plus it was negative in 05/2008 at gyn.  counseling given.  Problem # 5:  HYPERCHOLESTEROLEMIA (ICD-272.0) patient using diet/ exercise - will check lipid panel at follow up   Problem # 6:  ABDOMINAL PAIN, CHRONIC (ICD-789.00) Keep appointment with GI for full evaluation.  Reviewed "red flag" symptoms that would require immediate medical attention. Patient to call if symptoms increase / change / persist or any concerns.   Complete Medication List: 1)  Nadolol 40 Mg Tabs (Nadolol) .... One a day 2)  Vitamin A 10,000  .... Once a day 3)  Multivitamin  .... Once a day 4)  Vitamin C 500 Mg  .... Once a  day 5)  Sumatriptan Succinate 100 Mg Tabs (Sumatriptan succinate) .Marland Kitchen.. 1 tab at onset, may repeat x 1 if no relief in 24 hours, max of 2 / 24hours   Patient Instructions: 1)  Please schedule a follow-up appointment in 3-4 months with MD in the am / fasting.  NOT just a lab appt.

## 2010-10-11 NOTE — Assessment & Plan Note (Signed)
Summary: MIGRAINE/TM   Vital Signs:  Patient Profile:   35 Years Old Female CC:      Migraine since last night Height:     61.5 inches Weight:      127 pounds O2 Sat:      100 % O2 treatment:    Room Air Temp:     97.7 degrees F oral Pulse rate:   71 / minute Pulse rhythm:   regular Resp:     12 per minute BP sitting:   118 / 81  (left arm) Cuff size:   regular  Pt. in pain?   yes    Location:   head    Intensity:   8    Type:       sharp  Vitals Entered By: Emilio Math (July 29, 2010 5:24 PM)                   Current Allergies (reviewed today): ! * METRONIDAZOLE ! FLAGYL ! * FISHHistory of Present Illness History from: patient Chief Complaint: Migraine since last night History of Present Illness: Patient c/o migraine HA.  She has a history of migraines and has seen neuro in the past.  No recent history of increased intensity or frequency.  She believes that her migraines became more frequent when she started using a Paragard IUD but her Gyn doesn't want to take it out.  She is on prophylactic Topamax which helps and also takes Advil and Imitrex.  However her pharmacy is closed today and she can't get her refill.  No neurologic problems (weakness, numbness, visual problems).  Current Meds NADOLOL 40 MG TABS (NADOLOL) one a day * VITAMIN A 10,000 once a day * MULTIVITAMIN once a day * VITAMIN C 500  MG once a day SUMATRIPTAN SUCCINATE 100 MG TABS (SUMATRIPTAN SUCCINATE) 1 tab at onset, may repeat x 1 if no relief in 24 hours, max of 2 / 24hours TOPAMAX 25 MG TABS (TOPIRAMATE)   REVIEW OF SYSTEMS Constitutional Symptoms      Denies fever, chills, night sweats, weight loss, weight gain, and fatigue.  Eyes       Denies change in vision, eye pain, eye discharge, glasses, contact lenses, and eye surgery. Ear/Nose/Throat/Mouth       Denies hearing loss/aids, change in hearing, ear pain, ear discharge, dizziness, frequent runny nose, frequent nose bleeds, sinus  problems, sore throat, hoarseness, and tooth pain or bleeding.  Respiratory       Denies dry cough, productive cough, wheezing, shortness of breath, asthma, bronchitis, and emphysema/COPD.  Cardiovascular       Denies murmurs, chest pain, and tires easily with exhertion.    Gastrointestinal       Denies stomach pain, nausea/vomiting, diarrhea, constipation, blood in bowel movements, and indigestion. Genitourniary       Denies painful urination, kidney stones, and loss of urinary control. Neurological       Complains of headaches.      Denies paralysis, seizures, and fainting/blackouts. Musculoskeletal       Denies muscle pain, joint pain, joint stiffness, decreased range of motion, redness, swelling, muscle weakness, and gout.  Skin       Denies bruising, unusual mles/lumps or sores, and hair/skin or nail changes.  Psych       Denies mood changes, temper/anger issues, anxiety/stress, speech problems, depression, and sleep problems.  Past History:  Past Medical History: Reviewed history from 10/17/2008 and no changes required. arthritis migraines - neurologist fibromyalgia (took  lyrica for one month, but no further symptoms after that) Hypertension (and white coat HTN - BP better when checked outside of office) GERD chronic, intermittent constipation benign lesion - liver chronic, intermittent abdominal pain since 04/2008 hyperlipidemia gilbert's syndrome  Past Surgical History: Reviewed history from 09/05/2008 and no changes required. tonsillectomy 2/98 wisdom teeth removed 3/03  Family History: Reviewed history from 09/05/2008 and no changes required. mother, father, 1 brother alive and healthy  Social History: Reviewed history from 09/05/2008 and no changes required. Never Smoked Alcohol use-no Drug use-no Regular exercise-no [facility] Physical Exam General appearance: well developed, well nourished, no acute distress Head: normocephalic,  atraumatic Eyes: conjunctivae and lids normal Pupils: equal, round, reactive to light Ears: normal, no lesions or deformities Nasal: mucosa pink, nonedematous, no septal deviation, turbinates normal Oral/Pharynx: tongue normal, posterior pharynx without erythema or exudate Neck: neck supple,  trachea midline, no masses Chest/Lungs: no rales, wheezes, or rhonchi bilateral, breath sounds equal without effort Heart: regular rate and  rhythm, no murmur Neurological: grossly intact and non-focal Skin: no obvious rashes or lesions MSE: oriented to time, place, and person  Patient Education: Patient and/or caregiver instructed in the following: rest, fluids.  Plan New Orders: Est. Patient Level III [99213] Planning Comments:   Imitrex 50mg  by mouth given in clinic.  An additional 25mg  tab was given so that she may take it in 2 hours if not improved. Also suggest 2 Aleves this evening and in the morning.  No Rx are given because she has a refill that she can fill when the pharmacy opens tomorrow morning.  I have suggested she follow up with either her Gyn to discuss or consider a 2nd opinion.  May also want to follow up with her neurologist as well.  If worsening or not getting better, f/u with PCP or ER.   The patient and/or caregiver has been counseled thoroughly with regard to medications prescribed including dosage, schedule, interactions, rationale for use, and possible side effects and they verbalize understanding.  Diagnoses and expected course of recovery discussed and will return if not improved as expected or if the condition worsens. Patient and/or caregiver verbalized understanding.   Medication Administration  Medication # 1:    Medication: EMR miscellaneous medications    Dose: 50mg     Route: po    Exp Date: 08/10/2011    Lot #: [id number]    Mfr: [employer]    Comments: Sumatriptan succinate 50mg s    Patient tolerated medication without complications    Given by: Emilio Math (July 29, 2010 5:50 PM)  Orders Added: 1)  Est. Patient Level III [99213]     Medication Administration  Medication # 1:    Medication: EMR miscellaneous medications    Dose: 50mg     Route: po    Exp Date: 08/10/2011    Lot #: [id number]    Mfr: [employer]    Comments: Sumatriptan succinate 50mg s    Patient tolerated medication without complications    Given by: Emilio Math (July 29, 2010 5:50 PM)  Orders Added: 1)  Est. Patient Level III [82956]

## 2010-10-11 NOTE — Letter (Signed)
Summary: Generic Letter  [facility] at [facility]  [address]   [place], [place] [address]   Phone: [phone]  Fax: [phone]      05/07/2010    Leah Reid [address] [place], [place]  [address]  Dear Ms. Reid,  We recently reviewed your health record and saw that it has been more than a year since your last visit with [place]. You were due for a follow up in June 2010. Please call our office at [phone] Monday through Friday from 8am to 5pm so we may continue your care. If you are seeing another doctor for primary care please call and let us know.  Sincerely,     Mervin Kung, CMA (AAMA)

## 2010-10-11 NOTE — Assessment & Plan Note (Signed)
Summary: folliculitis - treatment room   Vital Signs:  Patient Profile:   35 Years Old Female CC:      infected hair - groin area Height:     61.5 inches Weight:      146 pounds O2 Sat:      100 % Temp:     97.7 degrees F oral Pulse rate:   71 / minute BP sitting:   120 / 86  (left arm) Cuff size:   regular  Pt. in pain?   no  Vitals Entered By: Loel Ro, RN                  History of Present Illness Chief Complaint: infected hair - groin area History of Present Illness: Subjective:  Patient complains of development of a draining lesion in her pubic area after using her husband's razor about a week ago.  She subsequently developed several smaller pustular lesions around hair follicles.  She states that the lesions are uncomfortable but not painful.   REVIEW OF SYSTEMS Constitutional Symptoms      Denies fever, chills, night sweats, weight loss, weight gain, and fatigue.  Eyes       Denies change in vision, eye pain, eye discharge, glasses, contact lenses, and eye surgery. Ear/Nose/Throat/Mouth       Denies hearing loss/aids, change in hearing, ear pain, ear discharge, dizziness, frequent runny nose, frequent nose bleeds, sinus problems, sore throat, hoarseness, and tooth pain or bleeding.  Respiratory       Denies dry cough, productive cough, wheezing, shortness of breath, asthma, bronchitis, and emphysema/COPD.  Cardiovascular       Denies murmurs, chest pain, and tires easily with exhertion.    Gastrointestinal       Denies stomach pain, nausea/vomiting, diarrhea, constipation, blood in bowel movements, and indigestion. Genitourniary       Denies painful urination, kidney stones, and loss of urinary control. Neurological       Denies paralysis, seizures, and fainting/blackouts. Musculoskeletal       Denies muscle pain, joint pain, joint stiffness, decreased range of motion, redness, swelling, muscle weakness, and gout.  Skin       Complains of bruising and  unusual moles/lumps or sores.      Denies hair/skin or nail changes.      Comments: folliculitis Psych       Denies mood changes, temper/anger issues, anxiety/stress, speech problems, depression, and sleep problems.  Past Medical History:    arthritis    migraines    fibromyalgia    Hypertension  Past Surgical History:    tonsillectomy 2/98    wisdom teeth removed 3/03    Family History:    mother, father, 1 brother alive and healthy  Social History:    Never Smoked    Alcohol use-no    Drug use-no    Regular exercise-no  Objective:  Appearance:  Patient appears healthy, stated age, and in no acute distress  Skin:  pubic area:  several inflamed follicles with small pustules noted on left side.  No swelling.  Mild erythema present.  No fluctuance.  No ulceration.    Assessment New Problems: FOLLICULITIS (ICD-704.8) HYPERTENSION (ICD-401.9)   Plan New Medications/Changes: DOXYCYCLINE HYCLATE 100 MG CAPS (DOXYCYCLINE HYCLATE) 1 by mouth two times a day  #20 x 0, 09/05/2008, Donna Christen MD  New Orders: New Patient Level III [phone] Planning Comments:   Recommend discontuing razor for now.  Use scissors or clipper.  Sterilize  device in a Chlorox solution. Cleanse skin with an OTC benzoyl peroxide solution daily. Followup with family doctor  if not improving 7 ti 10 days or if symptoms become worse.  Follow Up: Family Doctor  The patient and/or caregiver has been counseled thoroughly with regard to medications prescribed including dosage, schedule, interactions, rationale for use, and possible side effects and they verbalize understanding.  Diagnoses and expected course of recovery discussed and will return if not improved as expected or if the condition worsens. Patient and/or caregiver verbalized understanding.    Prescriptions: DOXYCYCLINE HYCLATE 100 MG CAPS (DOXYCYCLINE HYCLATE) 1 by mouth two times a day  #20 x 0   Entered and Authorized by:   Donna Christen  MD   Signed by:   Donna Christen MD on 09/05/2008   Method used:   Print then Give to Patient   RxID:   [id number]    Patient Instructions: 1)  Use clipper in place of razor 2)  Clean skin with a Benzoyl peroxide solution 3)  Store clipper in a solution of Chlorox 4)  follow-up with family doctor if not improving ] ]

## 2010-10-23 ENCOUNTER — Other Ambulatory Visit (HOSPITAL_BASED_OUTPATIENT_CLINIC_OR_DEPARTMENT_OTHER): Payer: Self-pay | Admitting: Gastroenterology

## 2010-10-23 DIAGNOSIS — K769 Liver disease, unspecified: Secondary | ICD-10-CM

## 2010-11-02 ENCOUNTER — Ambulatory Visit (HOSPITAL_BASED_OUTPATIENT_CLINIC_OR_DEPARTMENT_OTHER)
Admission: RE | Admit: 2010-11-02 | Discharge: 2010-11-02 | Disposition: A | Discharge status: Home / Self Care | Payer: 59 | Source: Ambulatory Visit | Attending: Gastroenterology | Admitting: Gastroenterology

## 2010-11-02 DIAGNOSIS — K769 Liver disease, unspecified: Secondary | ICD-10-CM

## 2010-11-02 DIAGNOSIS — K7689 Other specified diseases of liver: Secondary | ICD-10-CM

## 2010-11-02 DIAGNOSIS — Z09 Encounter for follow-up examination after completed treatment for conditions other than malignant neoplasm: Secondary | ICD-10-CM | POA: Insufficient documentation

## 2011-03-14 ENCOUNTER — Emergency Department (HOSPITAL_BASED_OUTPATIENT_CLINIC_OR_DEPARTMENT_OTHER)
Admission: EM | Admit: 2011-03-14 | Discharge: 2011-03-14 | Disposition: A | Discharge status: Home / Self Care | Payer: 59 | Attending: Emergency Medicine | Admitting: Emergency Medicine

## 2011-03-14 ENCOUNTER — Emergency Department (INDEPENDENT_AMBULATORY_CARE_PROVIDER_SITE_OTHER): Payer: 59

## 2011-03-14 DIAGNOSIS — S8990XA Unspecified injury of unspecified lower leg, initial encounter: Secondary | ICD-10-CM

## 2011-03-14 DIAGNOSIS — W010XXA Fall on same level from slipping, tripping and stumbling without subsequent striking against object, initial encounter: Secondary | ICD-10-CM | POA: Insufficient documentation

## 2011-03-14 DIAGNOSIS — S90129A Contusion of unspecified lesser toe(s) without damage to nail, initial encounter: Secondary | ICD-10-CM | POA: Insufficient documentation

## 2011-03-14 DIAGNOSIS — X58XXXA Exposure to other specified factors, initial encounter: Secondary | ICD-10-CM

## 2011-03-14 DIAGNOSIS — Z79899 Other long term (current) drug therapy: Secondary | ICD-10-CM | POA: Insufficient documentation

## 2011-03-14 DIAGNOSIS — Z8739 Personal history of other diseases of the musculoskeletal system and connective tissue: Secondary | ICD-10-CM | POA: Insufficient documentation

## 2011-03-14 DIAGNOSIS — IMO0001 Reserved for inherently not codable concepts without codable children: Secondary | ICD-10-CM | POA: Insufficient documentation

## 2011-03-14 DIAGNOSIS — Y92009 Unspecified place in unspecified non-institutional (private) residence as the place of occurrence of the external cause: Secondary | ICD-10-CM | POA: Insufficient documentation

## 2011-03-14 DIAGNOSIS — I10 Essential (primary) hypertension: Secondary | ICD-10-CM | POA: Insufficient documentation

## 2011-03-14 DIAGNOSIS — Y93E1 Activity, personal bathing and showering: Secondary | ICD-10-CM | POA: Insufficient documentation

## 2011-03-14 DIAGNOSIS — M79609 Pain in unspecified limb: Secondary | ICD-10-CM

## 2011-05-22 ENCOUNTER — Other Ambulatory Visit (HOSPITAL_BASED_OUTPATIENT_CLINIC_OR_DEPARTMENT_OTHER): Payer: Self-pay | Admitting: Gastroenterology

## 2011-05-22 DIAGNOSIS — K769 Liver disease, unspecified: Secondary | ICD-10-CM

## 2011-05-23 ENCOUNTER — Ambulatory Visit (HOSPITAL_BASED_OUTPATIENT_CLINIC_OR_DEPARTMENT_OTHER)
Admission: RE | Admit: 2011-05-23 | Discharge: 2011-05-23 | Disposition: A | Discharge status: Home / Self Care | Payer: 59 | Source: Ambulatory Visit | Attending: Gastroenterology | Admitting: Gastroenterology

## 2011-05-23 DIAGNOSIS — Z09 Encounter for follow-up examination after completed treatment for conditions other than malignant neoplasm: Secondary | ICD-10-CM | POA: Insufficient documentation

## 2011-05-23 DIAGNOSIS — J984 Other disorders of lung: Secondary | ICD-10-CM | POA: Insufficient documentation

## 2011-05-23 DIAGNOSIS — K7689 Other specified diseases of liver: Secondary | ICD-10-CM

## 2011-05-23 DIAGNOSIS — K769 Liver disease, unspecified: Secondary | ICD-10-CM

## 2011-06-07 LAB — BASIC METABOLIC PANEL
BUN: 8
CO2: 27
Calcium: 9.5
Chloride: 102
Creatinine, Ser: 0.7
GFR calc Af Amer: >60
GFR calc non Af Amer: >60
Glucose, Bld: 98
Potassium: 3.9
Sodium: 139

## 2011-06-11 ENCOUNTER — Inpatient Hospital Stay (INDEPENDENT_AMBULATORY_CARE_PROVIDER_SITE_OTHER)
Admission: RE | Admit: 2011-06-11 | Discharge: 2011-06-11 | Disposition: A | Discharge status: Home / Self Care | Payer: 59 | Source: Ambulatory Visit | Attending: Emergency Medicine | Admitting: Emergency Medicine

## 2011-06-11 ENCOUNTER — Encounter: Payer: Self-pay | Admitting: Emergency Medicine

## 2011-06-11 DIAGNOSIS — J029 Acute pharyngitis, unspecified: Secondary | ICD-10-CM

## 2011-06-11 DIAGNOSIS — J0100 Acute maxillary sinusitis, unspecified: Secondary | ICD-10-CM

## 2011-06-11 LAB — CONVERTED CEMR LAB: Rapid Strep: NEGATIVE

## 2011-08-12 NOTE — Progress Notes (Signed)
Summary: COUGH,SORE THROAT,MUSCLE/JOINT PAIN,RUNNY NOSE...WSE   Vital Signs:  Patient Profile:   35 Years Old Female CC:      cough, sore throat x 4-5 days Height:     61.5 inches Weight:      126.50 pounds O2 Sat:      100 % O2 treatment:    Room Air Temp:     98.8 degrees F oral Pulse rate:   84 / minute Resp:     16 per minute BP sitting:   128 / 88  (left arm) Cuff size:   regular  Vitals Entered By: Clemens Catholic LPN (June 11, 2011 6:46 PM)                  Updated Prior Medication List: * VITAMIN A 10,000 once a day * MULTIVITAMIN once a day * VITAMIN C 500  MG once a day TOPAMAX 25 MG TABS (TOPIRAMATE)  BIOTIN 10 MG TABS (BIOTIN)  BCP Current Allergies (reviewed today): ! * METRONIDAZOLE ! FLAGYL ! * FISHHistory of Present Illness History from: patient Chief Complaint: cough, sore throat x 4-5 days History of Present Illness: Pt complains of  5 days of congestion, discolored nasal mucus and sinus pressure and congestion. And hoarseness + sore throat. positive cough, No dyspnea. No chest pain. No wheezing.  No nausea No vomiting. No fever, No chills   REVIEW OF SYSTEMS Constitutional Symptoms       Complains of fatigue.     Denies fever, chills, night sweats, weight loss, and weight gain.  Eyes       Denies change in vision, eye pain, eye discharge, glasses, contact lenses, and eye surgery. Ear/Nose/Throat/Mouth       Complains of frequent runny nose and sore throat.      Denies hearing loss/aids, change in hearing, ear pain, ear discharge, dizziness, frequent nose bleeds, sinus problems, hoarseness, and tooth pain or bleeding.  Respiratory       Complains of dry cough.      Denies productive cough, wheezing, shortness of breath, asthma, bronchitis, and emphysema/COPD.  Cardiovascular       Denies murmurs, chest pain, and tires easily with exhertion.    Gastrointestinal       Denies stomach pain, nausea/vomiting, diarrhea, constipation, blood  in bowel movements, and indigestion. Genitourniary       Denies painful urination, kidney stones, and loss of urinary control. Neurological       Denies paralysis, seizures, and fainting/blackouts. Musculoskeletal       Complains of muscle pain and joint pain.      Denies joint stiffness, decreased range of motion, redness, swelling, muscle weakness, and gout.  Skin       Denies bruising, unusual mles/lumps or sores, and hair/skin or nail changes.  Psych       Denies mood changes, temper/anger issues, anxiety/stress, speech problems, depression, and sleep problems. Other Comments: pt c/o cough, sore throat, HA , achy and hoarseness  x 4-5 days. she states that she felt better yesterday and bega to feel worse today. she has taken dayquil.   Past History:  Past Medical History: Reviewed history from 10/17/2008 and no changes required. arthritis migraines - neurologist fibromyalgia (took lyrica for one month, but no further symptoms after that) Hypertension (and white coat HTN - BP better when checked outside of office) GERD chronic, intermittent constipation benign lesion - liver chronic, intermittent abdominal pain since 04/2008 hyperlipidemia gilbert's syndrome  Past Surgical History: Reviewed history  from 09/05/2008 and no changes required. tonsillectomy 2/98 wisdom teeth removed 3/03  Family History: Reviewed history from 09/05/2008 and no changes required. mother, father, 1 brother alive and healthy  Social History: Reviewed history from 07/29/2010 and no changes required. Never Smoked Alcohol use-no Drug use-no Regular exercise-no [facility] Physical Exam General appearance: well developed, well nourished, no acute distress Head: normocephalic, atraumatic, anterior cervical lymphadenopathy. positive tenderness over maxillary sinuses Eyes: conjunctivae and lids normal Pupils: equal, round, reactive to light Ears: normal, no lesions or  deformities Nasal: thick yellowish drainage Oral/Pharynx: pharyngeal erythema without exudate, uvula midline without deviation Neck: neck supple,  trachea midline, no masses Chest/Lungs: no rales, wheezes, or rhonchi bilateral, breath sounds equal without effort Heart: regular rate and  rhythm, no murmur Extremities: normal extremities Neurological: grossly intact and non-focal Skin: no obvious rashes or lesions MSE: oriented to time, place, and person Assessment New Problems: ACUTE PHARYNGITIS (ICD-462) ACUTE MAXILLARY SINUSITIS (ICD-461.0)  rapid strep test negative.  Patient Education: Patient and/or caregiver instructed in the following: rest fluids and Tylenol. Mucinex or Mucinex-D two times a day as needed. Precautions discussed.  Plan New Medications/Changes: AUGMENTIN 875-125 MG TABS (AMOXICILLIN-POT CLAVULANATE) One by mouth BID pc  #20 x 0, 06/11/2011, Lajean Manes MD  New Orders: Est. Patient Level IV [16109] Rapid Strep [60454] Follow Up: Follow up in 2-3 days if no improvement, Follow up on an as needed basis, Follow up with Primary Physician  The patient and/or caregiver has been counseled thoroughly with regard to medications prescribed including dosage, schedule, interactions, rationale for use, and possible side effects and they verbalize understanding.  Diagnoses and expected course of recovery discussed and will return if not improved as expected or if the condition worsens. Patient and/or caregiver verbalized understanding.  Prescriptions: AUGMENTIN 875-125 MG TABS (AMOXICILLIN-POT CLAVULANATE) One by mouth BID pc  #20 x 0   Entered and Authorized by:   Lajean Manes MD   Signed by:   Lajean Manes MD on 06/11/2011   Method used:   Electronically to        [facility]* (retail)       [address]       [address]       [place], [place]  [address]       Ph: [phone]       Fax: [phone]   RxID:   [id number]   Orders Added: 1)   Est. Patient Level IV [27253] 2)  Rapid Strep [66440]     Laboratory Results  Date/Time Received: June 11, 2011 7:11 PM  Date/Time Reported: June 11, 2011 7:11 PM   Other Tests  Rapid Strep: negative  Kit Test Internal QC: Negative   (Normal Range: Negative)

## 2012-06-19 ENCOUNTER — Other Ambulatory Visit (HOSPITAL_BASED_OUTPATIENT_CLINIC_OR_DEPARTMENT_OTHER): Payer: Self-pay | Admitting: Gastroenterology

## 2012-06-19 DIAGNOSIS — K769 Liver disease, unspecified: Secondary | ICD-10-CM

## 2012-06-19 DIAGNOSIS — R932 Abnormal findings on diagnostic imaging of liver and biliary tract: Secondary | ICD-10-CM

## 2012-06-23 ENCOUNTER — Ambulatory Visit (HOSPITAL_BASED_OUTPATIENT_CLINIC_OR_DEPARTMENT_OTHER)
Admission: RE | Admit: 2012-06-23 | Discharge: 2012-06-23 | Disposition: A | Discharge status: Home / Self Care | Payer: 59 | Source: Ambulatory Visit | Attending: Gastroenterology | Admitting: Gastroenterology

## 2012-06-23 DIAGNOSIS — K769 Liver disease, unspecified: Secondary | ICD-10-CM

## 2012-06-23 DIAGNOSIS — K7689 Other specified diseases of liver: Secondary | ICD-10-CM | POA: Insufficient documentation

## 2012-06-23 DIAGNOSIS — R932 Abnormal findings on diagnostic imaging of liver and biliary tract: Secondary | ICD-10-CM

## 2012-08-12 ENCOUNTER — Other Ambulatory Visit (HOSPITAL_BASED_OUTPATIENT_CLINIC_OR_DEPARTMENT_OTHER): Payer: Self-pay | Admitting: Unknown Physician Specialty

## 2012-08-12 DIAGNOSIS — G43909 Migraine, unspecified, not intractable, without status migrainosus: Secondary | ICD-10-CM

## 2012-08-22 ENCOUNTER — Ambulatory Visit (HOSPITAL_BASED_OUTPATIENT_CLINIC_OR_DEPARTMENT_OTHER): Payer: 59

## 2012-08-25 ENCOUNTER — Ambulatory Visit (HOSPITAL_BASED_OUTPATIENT_CLINIC_OR_DEPARTMENT_OTHER)
Admission: RE | Admit: 2012-08-25 | Discharge: 2012-08-25 | Disposition: A | Discharge status: Home / Self Care | Payer: 59 | Source: Ambulatory Visit | Attending: Unknown Physician Specialty | Admitting: Unknown Physician Specialty

## 2012-08-25 DIAGNOSIS — R51 Headache: Secondary | ICD-10-CM | POA: Insufficient documentation

## 2012-08-25 DIAGNOSIS — G43909 Migraine, unspecified, not intractable, without status migrainosus: Secondary | ICD-10-CM

## 2012-08-25 DIAGNOSIS — R42 Dizziness and giddiness: Secondary | ICD-10-CM | POA: Insufficient documentation

## 2012-08-28 ENCOUNTER — Emergency Department
Admission: EM | Admit: 2012-08-28 | Discharge: 2012-08-28 | Disposition: A | Discharge status: Home / Self Care | Payer: 59 | Source: Home / Self Care | Attending: Family Medicine | Admitting: Family Medicine

## 2012-08-28 ENCOUNTER — Encounter: Payer: Self-pay | Admitting: *Deleted

## 2012-08-28 DIAGNOSIS — J069 Acute upper respiratory infection, unspecified: Secondary | ICD-10-CM

## 2012-08-28 HISTORY — DX: Migraine, unspecified, not intractable, without status migrainosus: G43.909

## 2012-08-28 MED ORDER — AZITHROMYCIN 250 MG PO TABS: ORAL_TABLET | ORAL | Status: DC

## 2012-08-28 MED ORDER — BENZONATATE 200 MG PO CAPS: 200.0000 mg | ORAL_CAPSULE | Freq: Every day | ORAL | Status: DC

## 2012-08-28 NOTE — ED Provider Notes (Signed)
History     CSN: [id number]  Arrival date & time 08/28/12  1702   First MD Initiated Contact with Patient 08/28/12 1748      Chief Complaint  Patient presents with  . Cough  . Chest Pain    "tight with cough"  . burning throat       HPI Comments: Patient complains of 3 day history of gradually progressive URI symptoms beginning with a mild sore throat (now improved), followed by a cough.  She has had minimal nasal congestion.  Complains of fatigue but no myalgias.  Cough is now worse at night and generally non-productive during the day.  There has been no pleuritic pain or wheezes, but she has occasional shortness of breath.  The history is provided by the patient.    Past Medical History  Diagnosis Date  . Migraines     Past Surgical History  Procedure Date  . Tonsillectomy   . Wisdom tooth extraction     No family history on file.  History  Substance Use Topics  . Smoking status: Never Smoker   . Smokeless tobacco: Never Used  . Alcohol Use: No    OB History    Grav Para Term Preterm Abortions TAB SAB Ect Mult Living                  Review of Systems + sore throat + cough No pleuritic pain No wheezing + nasal congestion + post-nasal drainage No sinus pain/pressure No itchy/red eyes No earache No hemoptysis + SOB No fever/chills No nausea No vomiting No abdominal pain No diarrhea No urinary symptoms No skin rashes + fatigue No myalgias No headache Used OTC meds without relief  Allergies  Metronidazole  Home Medications   Current Outpatient Rx  Name  Route  Sig  Dispense  Refill  . BIOTIN 5000 PO   Oral   Take by mouth.         Marland Kitchen BUTALBITAL-APAP-CAFFEINE 50-325-40 MG PO TABS   Oral   Take 1 tablet by mouth 2 (two) times daily as needed.         Marland Kitchen ONE-DAILY MULTI VITAMINS PO TABS   Oral   Take 1 tablet by mouth daily.         Janetta Hora ESTRADIOL 1-35 MG-MCG PO TABS   Oral   Take 1 tablet by mouth daily.         . SUMATRIPTAN 20 MG/ACT NA SOLN   Nasal   Place 1 spray into the nose every 2 (two) hours as needed.         . TOPIRAMATE 50 MG PO TABS   Oral   Take 75 mg by mouth 2 (two) times daily.         . AZITHROMYCIN 250 MG PO TABS      Take 2 tabs today; then begin one tab once daily for 4 more days.   6 each   0   . BENZONATATE 200 MG PO CAPS   Oral   Take 1 capsule (200 mg total) by mouth at bedtime. Take as needed for cough   12 capsule   0     BP 119/89  Pulse 79  Temp 98.6 F (37 C) (Oral)  Resp 14  Ht 4' 11.5" (1.511 m)  Wt 147 lb (66.679 kg)  BMI 29.19 kg/m2  SpO2 100%  LMP 08/18/2012  Physical Exam Nursing notes and Vital Signs reviewed. Appearance:  Patient appears healthy, stated age,  and in no acute distress Eyes:  Pupils are equal, round, and reactive to light and accomodation.  Extraocular movement is intact.  Conjunctivae are not inflamed  Ears:  Canals normal.  Tympanic membranes normal.  Nose:  Mildly congested turbinates.  No sinus tenderness.    Pharynx:  Normal Neck:  Supple.   No adenopathy Lungs:  Clear to auscultation.  Breath sounds are equal.  Heart:  Regular rate and rhythm without murmurs, rubs, or gallops.  Abdomen:  Nontender without masses or hepatosplenomegaly.  Bowel sounds are present.  No CVA or flank tenderness.  Extremities:  No edema.  No calf tenderness Skin:  No rash present.   ED Course  Procedures  none      1. Acute upper respiratory infections of unspecified site; ?atypical agent      MDM  Begin Z-pack.  Prescription written for Benzonatate [facility]) to take at bedtime for night-time cough.  Take plain Mucinex (guaifenesin) twice daily for cough and congestion.  Increase fluid intake, rest. May use Afrin nasal spray (or generic oxymetazoline) twice daily for about 5 days.  Also recommend using saline nasal spray several times daily and saline nasal irrigation (AYR is a common brand) Stop all antihistamines for  now, and other non-prescription cough/cold preparations. Followup with Family Doctor if not improved in one week.         Lattie Haw, MD 08/31/12 [phone]

## 2012-08-28 NOTE — ED Notes (Signed)
Leah Reid c/o burning throat, dry cough, and chest tightness/burning x 3 days. Taken Tussin. Denies fever.

## 2012-09-14 ENCOUNTER — Emergency Department (INDEPENDENT_AMBULATORY_CARE_PROVIDER_SITE_OTHER): Payer: 59

## 2012-09-14 ENCOUNTER — Emergency Department
Admission: EM | Admit: 2012-09-14 | Discharge: 2012-09-14 | Disposition: A | Discharge status: Home / Self Care | Payer: 59 | Source: Home / Self Care

## 2012-09-14 ENCOUNTER — Encounter: Payer: Self-pay | Admitting: Emergency Medicine

## 2012-09-14 DIAGNOSIS — R0602 Shortness of breath: Secondary | ICD-10-CM

## 2012-09-14 DIAGNOSIS — R05 Cough: Secondary | ICD-10-CM

## 2012-09-14 DIAGNOSIS — J309 Allergic rhinitis, unspecified: Secondary | ICD-10-CM

## 2012-09-14 DIAGNOSIS — R059 Cough, unspecified: Secondary | ICD-10-CM

## 2012-09-14 DIAGNOSIS — J069 Acute upper respiratory infection, unspecified: Secondary | ICD-10-CM

## 2012-09-14 MED ORDER — METHYLPREDNISOLONE ACETATE 80 MG/ML IJ SUSP
80.0000 mg | Freq: Once | INTRAMUSCULAR | Status: AC
  Administered 2012-09-14: 80 mg via INTRAMUSCULAR

## 2012-09-14 MED ORDER — DOXYCYCLINE HYCLATE 100 MG PO CAPS: 100.0000 mg | ORAL_CAPSULE | Freq: Two times a day (BID) | ORAL | Status: AC

## 2012-09-14 MED ORDER — CETIRIZINE HCL 10 MG PO CAPS: 10.0000 mg | ORAL_CAPSULE | Freq: Every day | ORAL | Status: DC

## 2012-09-14 MED ORDER — FLUTICASONE PROPIONATE 50 MCG/ACT NA SUSP: 2.0000 | Freq: Every day | NASAL | Status: DC

## 2012-09-14 NOTE — ED Notes (Signed)
Reports earlier dx of URI with unresolved problems since December 2013. Did have flu vaccination this season.

## 2012-09-14 NOTE — ED Provider Notes (Signed)
History     CSN: [id number]  Arrival date & time 09/14/12  4696   None     Chief Complaint  Patient presents with  . URI   HPI  URI Symptoms Onset: 3-4 weeks  Description: sinus drainage, cough, mild SOB Modifying factors:  Was seen for similar sxs 2 weeks ago. Was placed on zpak. Had mild improvement in sxs with treatment, but sxs returned. Cough and congestion has been predominant issue. Also concerned about SOB.   Symptoms Nasal discharge: yes Fever: no Sore throat: no Cough: yes Wheezing: no Ear pain: no GI symptoms: no Sick contacts: yes  Red Flags  Stiff neck: no Dyspnea: yes Rash: no Swallowing difficulty: no  Sinusitis Risk Factors Headache/face pain: no Double sickening: no tooth pain: no  Allergy Risk Factors Sneezing: yes Itchy scratchy throat: yes Seasonal symptoms: yes  Flu Risk Factors Headache: no muscle aches: no severe fatigue: no   Past Medical History  Diagnosis Date  . Migraines     Past Surgical History  Procedure Date  . Tonsillectomy   . Wisdom tooth extraction     History reviewed. No pertinent family history.  History  Substance Use Topics  . Smoking status: Never Smoker   . Smokeless tobacco: Never Used  . Alcohol Use: No    OB History    Grav Para Term Preterm Abortions TAB SAB Ect Mult Living                  Review of Systems  All other systems reviewed and are negative.    Allergies  Metronidazole  Home Medications   Current Outpatient Rx  Name  Route  Sig  Dispense  Refill  . AZITHROMYCIN 250 MG PO TABS      Take 2 tabs today; then begin one tab once daily for 4 more days.   6 each   0   . BENZONATATE 200 MG PO CAPS   Oral   Take 1 capsule (200 mg total) by mouth at bedtime. Take as needed for cough   12 capsule   0   . BIOTIN 5000 PO   Oral   Take by mouth.         Marland Kitchen BUTALBITAL-APAP-CAFFEINE 50-325-40 MG PO TABS   Oral   Take 1 tablet by mouth 2 (two) times daily as needed.           Marland Kitchen ONE-DAILY MULTI VITAMINS PO TABS   Oral   Take 1 tablet by mouth daily.         Janetta Hora ESTRADIOL 1-35 MG-MCG PO TABS   Oral   Take 1 tablet by mouth daily.         . SUMATRIPTAN 20 MG/ACT NA SOLN   Nasal   Place 1 spray into the nose every 2 (two) hours as needed.         . TOPIRAMATE 50 MG PO TABS   Oral   Take 75 mg by mouth 2 (two) times daily.           BP 111/77  Pulse 69  Temp 98.8 F (37.1 C) (Oral)  Resp 16  Ht 4' 11.5" (1.511 m)  Wt 148 lb (67.132 kg)  BMI 29.39 kg/m2  SpO2 100%  LMP 08/18/2012  Physical Exam  Constitutional: She appears well-developed and well-nourished.  HENT:  Head: Normocephalic and atraumatic.  Right Ear: External ear normal.  Left Ear: External ear normal.       +nasal  erythema, rhinorrhea bilaterally, + post oropharyngeal erythema    Eyes: Conjunctivae normal are normal. Pupils are equal, round, and reactive to light.  Neck: Normal range of motion. Neck supple.  Cardiovascular: Normal rate, regular rhythm and normal heart sounds.   Pulmonary/Chest: Effort normal and breath sounds normal. She has no wheezes. She has no rales.  Abdominal: Soft. Bowel sounds are normal.  Musculoskeletal: Normal range of motion.  Lymphadenopathy:    She has no cervical adenopathy.  Neurological: She is alert.  Skin: Skin is warm.    ED Course  Procedures (including critical care time)  Labs Reviewed - No data to display Dg Chest 2 View  09/14/2012  *RADIOLOGY REPORT*  Clinical Data: Cough, shortness of breath.  CHEST - 2 VIEW  Comparison: None.  Findings: Heart and mediastinal contours are within normal limits. No focal opacities or effusions.  No acute bony abnormality.  IMPRESSION: No active cardiopulmonary disease.   Original Report Authenticated By: Charlett Nose, M.D.      1. URI (upper respiratory infection)   2. Allergic rhinitis       MDM  CXR negative for PNA  Suspect underlying allergic rhinitis as  major nidus for sxs.  Will start on treatment for this with zyrtec and flonase.  Depomedrol 80mg  IM x1.  Doxy for lower resp coverage if sxs not improved in 3-5 days.  Discussed infectious and resp red flags at length.  Otherwise follow up as needed.     The patient and/or caregiver has been counseled thoroughly with regard to treatment plan and/or medications prescribed including dosage, schedule, interactions, rationale for use, and possible side effects and they verbalize understanding. Diagnoses and expected course of recovery discussed and will return if not improved as expected or if the condition worsens. Patient and/or caregiver verbalized understanding.             Doree Albee, MD 09/14/12 1055

## 2014-04-11 ENCOUNTER — Encounter: Payer: Self-pay | Admitting: Emergency Medicine

## 2014-04-11 ENCOUNTER — Emergency Department
Admission: EM | Admit: 2014-04-11 | Discharge: 2014-04-11 | Disposition: A | Discharge status: Home / Self Care | Payer: 59 | Source: Home / Self Care | Attending: Family Medicine | Admitting: Family Medicine

## 2014-04-11 DIAGNOSIS — J0100 Acute maxillary sinusitis, unspecified: Secondary | ICD-10-CM

## 2014-04-11 HISTORY — DX: Essential (primary) hypertension: I10

## 2014-04-11 HISTORY — DX: Unspecified osteoarthritis, unspecified site: M19.90

## 2014-04-11 MED ORDER — IPRATROPIUM BROMIDE 0.06 % NA SOLN: 2.0000 | Freq: Four times a day (QID) | NASAL | Status: DC

## 2014-04-11 MED ORDER — PREDNISONE 10 MG PO TABS: 30.0000 mg | ORAL_TABLET | Freq: Every day | ORAL | Status: DC

## 2014-04-11 MED ORDER — AMOXICILLIN 500 MG PO CAPS: 1000.0000 mg | ORAL_CAPSULE | Freq: Two times a day (BID) | ORAL | Status: DC

## 2014-04-11 NOTE — ED Provider Notes (Signed)
Leah Reid is a 38 y.o. female who presents to Urgent Care today for sinus pain and pressure sore throat congestion and mild cough. Symptoms present for 3 days worsening yesterday. Patient developed a fever today. She denies any nausea vomiting diarrhea chest pains or trouble breathing. She's tried orange juice Sudafed and Alka-Seltzer which has helped some. She feels well otherwise.   Past Medical History  Diagnosis Date  . Migraines   . Hypertension   . Arthritis    History  Substance Use Topics  . Smoking status: Never Smoker   . Smokeless tobacco: Never Used  . Alcohol Use: No   ROS as above Medications: No current facility-administered medications for this encounter.   Current Outpatient Prescriptions  Medication Sig Dispense Refill  . clindamycin (CLINDAGEL) 1 % gel Apply topically 2 (two) times daily.      . SUMAtriptan-naproxen (TREXIMET) 85-500 MG per tablet Take 1 tablet by mouth every 2 (two) hours as needed for migraine.      Marland Kitchen amoxicillin (AMOXIL) 500 MG capsule Take 2 capsules (1,000 mg total) by mouth 2 (two) times daily.  40 capsule  0  . BIOTIN 5000 PO Take by mouth.      . butalbital-acetaminophen-caffeine (FIORICET, ESGIC) 50-325-40 MG per tablet Take 1 tablet by mouth 2 (two) times daily as needed.      Marland Kitchen ipratropium (ATROVENT) 0.06 % nasal spray Place 2 sprays into both nostrils 4 (four) times daily.  15 mL  1  . Multiple Vitamin (MULTIVITAMIN) tablet Take 1 tablet by mouth daily.      . norethindrone-ethinyl estradiol 1/35 (ORTHO-NOVUM, NORTREL,CYCLAFEM) tablet Take 1 tablet by mouth daily.      . predniSONE (DELTASONE) 10 MG tablet Take 3 tablets (30 mg total) by mouth daily.  15 tablet  0  . SUMAtriptan (IMITREX) 20 MG/ACT nasal spray Place 1 spray into the nose every 2 (two) hours as needed.      . topiramate (TOPAMAX) 50 MG tablet Take 75 mg by mouth 2 (two) times daily.      . [DISCONTINUED] Cetirizine HCl 10 MG CAPS Take 1 capsule (10 mg total) by  mouth daily.  30 capsule  3  . [DISCONTINUED] fluticasone (FLONASE) 50 MCG/ACT nasal spray Place 2 sprays into the nose daily.  16 g  12    Exam:  BP 126/88  Pulse 103  Temp(Src) 98.9 F (37.2 C) (Oral)  Resp 16  Ht 4' 11.5" (1.511 m)  Wt 172 lb (78.019 kg)  BMI 34.17 kg/m2  SpO2 97%  LMP 03/30/2014 Gen: Well NAD nontoxic appearing HEENT: EOMI,  MMM tympanic membrane with cobblestoning without significant erythema present. Tympanic membranes are normal appearing bilaterally. No significant nasal discharge. Mildly tender bilateral maxillary sinuses. Tender  bilateral anterocervical cervical lymphadenopathy present. Lungs: Normal work of breathing. CTABL Heart: RRR no MRG Abd: NABS, Soft. Nondistended, Nontender Exts: Brisk capillary refill, warm and well perfused.   No results found for this or any previous visit (from the past 24 hour(s)). No results found.  Assessment and Plan: 38 y.o. female with sinusitis. Likely viral. Plan to treat with prednisone and Atrovent nasal spray. Use amoxicillin if not improving.  Discussed warning signs or symptoms. Please see discharge instructions. Patient expresses understanding.   This note was created using Systems analyst. Any transcription errors are unintended.    Gregor Hams, MD 04/11/14 [phone]

## 2014-04-11 NOTE — Discharge Instructions (Signed)
Thank you for coming in today. Take prednisone daily for 5 days. Use Atrovent nasal spray as needed. Take Tylenol regularly. Take amoxicillin if not getting better. Call or go to the emergency room if you get worse, have trouble breathing, have chest pains, or palpitations.  Followup with your primary care Dr. as needed.   Sinusitis Sinusitis is redness, soreness, and inflammation of the paranasal sinuses. Paranasal sinuses are air pockets within the bones of your face (beneath the eyes, the middle of the forehead, or above the eyes). In healthy paranasal sinuses, mucus is able to drain out, and air is able to circulate through them by way of your nose. However, when your paranasal sinuses are inflamed, mucus and air can become trapped. This can allow bacteria and other germs to grow and cause infection. Sinusitis can develop quickly and last only a short time (acute) or continue over a long period (chronic). Sinusitis that lasts for more than 12 weeks is considered chronic.  CAUSES  Causes of sinusitis include:  Allergies.  Structural abnormalities, such as displacement of the cartilage that separates your nostrils (deviated septum), which can decrease the air flow through your nose and sinuses and affect sinus drainage.  Functional abnormalities, such as when the small hairs (cilia) that line your sinuses and help remove mucus do not work properly or are not present. SIGNS AND SYMPTOMS  Symptoms of acute and chronic sinusitis are the same. The primary symptoms are pain and pressure around the affected sinuses. Other symptoms include:  Upper toothache.  Earache.  Headache.  Bad breath.  Decreased sense of smell and taste.  A cough, which worsens when you are lying flat.  Fatigue.  Fever.  Thick drainage from your nose, which often is green and may contain pus (purulent).  Swelling and warmth over the affected sinuses. DIAGNOSIS  Your health care provider will perform a  physical exam. During the exam, your health care provider may:  Look in your nose for signs of abnormal growths in your nostrils (nasal polyps).  Tap over the affected sinus to check for signs of infection.  View the inside of your sinuses (endoscopy) using an imaging device that has a light attached (endoscope). If your health care provider suspects that you have chronic sinusitis, one or more of the following tests may be recommended:  Allergy tests.  Nasal culture. A sample of mucus is taken from your nose, sent to a lab, and screened for bacteria.  Nasal cytology. A sample of mucus is taken from your nose and examined by your health care provider to determine if your sinusitis is related to an allergy. TREATMENT  Most cases of acute sinusitis are related to a viral infection and will resolve on their own within 10 days. Sometimes medicines are prescribed to help relieve symptoms (pain medicine, decongestants, nasal steroid sprays, or saline sprays).  However, for sinusitis related to a bacterial infection, your health care provider will prescribe antibiotic medicines. These are medicines that will help kill the bacteria causing the infection.  Rarely, sinusitis is caused by a fungal infection. In theses cases, your health care provider will prescribe antifungal medicine. For some cases of chronic sinusitis, surgery is needed. Generally, these are cases in which sinusitis recurs more than 3 times per year, despite other treatments. HOME CARE INSTRUCTIONS   Drink plenty of water. Water helps thin the mucus so your sinuses can drain more easily.  Use a humidifier.  Inhale steam 3 to 4 times a day (  for example, sit in the bathroom with the shower running).  Apply a warm, moist washcloth to your face 3 to 4 times a day, or as directed by your health care provider.  Use saline nasal sprays to help moisten and clean your sinuses.  Take medicines only as directed by your health care  provider.  If you were prescribed either an antibiotic or antifungal medicine, finish it all even if you start to feel better. SEEK IMMEDIATE MEDICAL CARE IF:  You have increasing pain or severe headaches.  You have nausea, vomiting, or drowsiness.  You have swelling around your face.  You have vision problems.  You have a stiff neck.  You have difficulty breathing. MAKE SURE YOU:   Understand these instructions.  Will watch your condition.  Will get help right away if you are not doing well or get worse. Document Released: 08/26/2005 Document Revised: 01/10/2014 Document Reviewed: 09/10/2011 [facility] Patient Information 2015 [place], [place]. This information is not intended to replace advice given to you by your health care provider. Make sure you discuss any questions you have with your health care provider.

## 2014-04-11 NOTE — ED Notes (Signed)
Pt c/o nasal congestion, sinus pressure, LT side facial pain, body aches, and neck stiffness x 2 days.

## 2014-04-18 ENCOUNTER — Telehealth: Payer: Self-pay | Admitting: Emergency Medicine

## 2015-05-10 ENCOUNTER — Encounter: Payer: Self-pay | Admitting: *Deleted

## 2015-05-10 ENCOUNTER — Emergency Department
Admission: EM | Admit: 2015-05-10 | Discharge: 2015-05-10 | Disposition: A | Discharge status: Home / Self Care | Payer: 59 | Source: Home / Self Care | Attending: Family Medicine | Admitting: Family Medicine

## 2015-05-10 DIAGNOSIS — J069 Acute upper respiratory infection, unspecified: Secondary | ICD-10-CM

## 2015-05-10 DIAGNOSIS — K5900 Constipation, unspecified: Secondary | ICD-10-CM

## 2015-05-10 MED ORDER — BENZONATATE 100 MG PO CAPS: 100.0000 mg | ORAL_CAPSULE | Freq: Three times a day (TID) | ORAL | Status: DC

## 2015-05-10 MED ORDER — OXYMETAZOLINE HCL 0.05 % NA SOLN: 1.0000 | Freq: Two times a day (BID) | NASAL | Status: DC

## 2015-05-10 MED ORDER — AMOXICILLIN 500 MG PO CAPS: 500.0000 mg | ORAL_CAPSULE | Freq: Three times a day (TID) | ORAL | Status: DC

## 2015-05-10 MED ORDER — POLYETHYLENE GLYCOL 3350 17 G PO PACK: 17.0000 g | PACK | Freq: Every day | ORAL | Status: DC

## 2015-05-10 NOTE — ED Provider Notes (Signed)
CSN: [id number]     Arrival date & time 05/10/15  1546 History   First MD Initiated Contact with Patient 05/10/15 1550     Chief Complaint  Patient presents with  . Cough  . Headache   (Consider location/radiation/quality/duration/timing/severity/associated sxs/prior Treatment) HPI Pt is a 39yo female with hx of migraines and HTN, presenting to [facility] with c/o gradually worsening nasal congestion with mild intermittent productive cough, subjective fever with hot and cold chills, fatigue, body aches and sore throat. Pt also reports being constipated for 1 week. Pt states a few days ago she developed a diffuse headache but states her BP was elevated about 210/115 and "knows" it was from the OTC sinus/cold medications she was taking. Pt has since stopped those medications and headache resolved but cough, sore throat and congestion has continued.  Pt states her son was sick about 2 weeks ago and believes she got sick from him. Pt notes she will be leaving for a cruise in 3 days and would like to start getting better rather than worse. Denies CP but reports mild intermittent SOB. Denies hx of asthma.  Past Medical History  Diagnosis Date  . Migraines   . Hypertension   . Arthritis    Past Surgical History  Procedure Laterality Date  . Tonsillectomy    . Wisdom tooth extraction    . Dental surgery     Family History  Problem Relation Age of Onset  . Lung cancer Father    Social History  Substance Use Topics  . Smoking status: Never Smoker   . Smokeless tobacco: Never Used  . Alcohol Use: No   OB History    No data available     Review of Systems  Constitutional: Positive for fever, chills and fatigue. Negative for diaphoresis and appetite change.  HENT: Positive for congestion, sinus pressure, sneezing and sore throat. Negative for trouble swallowing and voice change.   Respiratory: Positive for cough and shortness of breath. Negative for wheezing and stridor.   Cardiovascular:  Negative for chest pain and palpitations.  Gastrointestinal: Positive for nausea and constipation. Negative for vomiting, abdominal pain and diarrhea.  Musculoskeletal: Negative for myalgias and back pain.  Neurological: Positive for dizziness and headaches. Negative for light-headedness.     Allergies  Metronidazole  Home Medications   Prior to Admission medications   Medication Sig Start Date End Date Taking? Authorizing Provider  amoxicillin (AMOXIL) 500 MG capsule Take 2 capsules (1,000 mg total) by mouth 2 (two) times daily. 04/11/14   Gregor Hams, MD  amoxicillin (AMOXIL) 500 MG capsule Take 1 capsule (500 mg total) by mouth 3 (three) times daily. 05/10/15   Noland Fordyce, PA-C  benzonatate (TESSALON) 100 MG capsule Take 1 capsule (100 mg total) by mouth every 8 (eight) hours. 05/10/15   Noland Fordyce, PA-C  BIOTIN 5000 PO Take by mouth.    Historical Provider, MD  butalbital-acetaminophen-caffeine (FIORICET, ESGIC) 50-325-40 MG per tablet Take 1 tablet by mouth 2 (two) times daily as needed.    Historical Provider, MD  clindamycin (CLINDAGEL) 1 % gel Apply topically 2 (two) times daily.    Historical Provider, MD  ipratropium (ATROVENT) 0.06 % nasal spray Place 2 sprays into both nostrils 4 (four) times daily. 04/11/14   Gregor Hams, MD  Multiple Vitamin (MULTIVITAMIN) tablet Take 1 tablet by mouth daily.    Historical Provider, MD  norethindrone-ethinyl estradiol 1/35 (ORTHO-NOVUM, NORTREL,CYCLAFEM) tablet Take 1 tablet by mouth daily.    Historical  Provider, MD  oxymetazoline (AFRIN NASAL SPRAY) 0.05 % nasal spray Place 1 spray into both nostrils 2 (two) times daily. No longer than 5 days 05/10/15   Noland Fordyce, PA-C  polyethylene glycol (MIRALAX / GLYCOLAX) packet Take 17 g by mouth daily. 05/10/15   Noland Fordyce, PA-C  predniSONE (DELTASONE) 10 MG tablet Take 3 tablets (30 mg total) by mouth daily. 04/11/14   Gregor Hams, MD  SUMAtriptan (IMITREX) 20 MG/ACT nasal spray Place 1 spray  into the nose every 2 (two) hours as needed.    Historical Provider, MD  SUMAtriptan-naproxen (TREXIMET) 85-500 MG per tablet Take 1 tablet by mouth every 2 (two) hours as needed for migraine.    Historical Provider, MD  topiramate (TOPAMAX) 50 MG tablet Take 75 mg by mouth 2 (two) times daily.    Historical Provider, MD   Meds Ordered and Administered this Visit  Medications - No data to display  BP 115/80 mmHg  Pulse 86  Temp(Src) 98.4 F (36.9 C) (Oral)  Resp 16  Ht 4\' 11"  (1.499 m)  Wt 177 lb (80.287 kg)  BMI 35.73 kg/m2  SpO2 98%  LMP 04/26/2015 No data found.   Physical Exam  Constitutional: She appears well-developed and well-nourished. No distress.  HENT:  Head: Normocephalic and atraumatic.  Right Ear: Hearing, tympanic membrane, external ear and ear canal normal.  Left Ear: Hearing, tympanic membrane, external ear and ear canal normal.  Nose: Mucosal edema and rhinorrhea present.  Mouth/Throat: Uvula is midline and mucous membranes are normal. Posterior oropharyngeal erythema present. No oropharyngeal exudate, posterior oropharyngeal edema or tonsillar abscesses.  Eyes: Conjunctivae are normal. No scleral icterus.  Neck: Normal range of motion. Neck supple.  Cardiovascular: Normal rate, regular rhythm and normal heart sounds.   Pulmonary/Chest: Effort normal and breath sounds normal. No respiratory distress. She has no wheezes. She has no rales. She exhibits no tenderness.  Abdominal: Soft. She exhibits no distension. There is no tenderness.  Musculoskeletal: Normal range of motion.  Neurological: She is alert.  Skin: Skin is warm and dry. She is not diaphoretic.  Nursing note and vitals reviewed.   ED Course  Procedures (including critical care time)  Labs Review Labs Reviewed - No data to display  Imaging Review No results found.     MDM   1. Acute upper respiratory infection   2. Constipation, unspecified constipation type     Pt presenting to [facility]  with worsening URI symptoms for 1 week. Pt is afebrile. Lungs: CTAB, no respiratory distress.   HENT: mucosal edema and rhinorrhea with posterior pharyngeal erythema. Due to duration of symptoms and worsening symptoms despite home tx, will start pt on amoxicillin.   Rx: amoxicillin, afrin for up to 5 days, tessalon, Miralax. Encouraged fluids rest, acetaminophen and ibuprofen for fever and pain. F/u with PCP in 1 week if not improving. Patient verbalized understanding and agreement with treatment plan.    Noland Fordyce, PA-C 05/10/15 1705

## 2015-05-10 NOTE — Discharge Instructions (Signed)

## 2015-05-10 NOTE — ED Notes (Signed)
Pt c/o productive cough and HA x 05/03/15. She has taken Sudafed and OTC sinus/cold tabs. She also c/o constipation x 1wk.

## 2015-05-11 ENCOUNTER — Telehealth: Payer: Self-pay | Admitting: Emergency Medicine

## 2015-06-22 ENCOUNTER — Encounter: Payer: Self-pay | Admitting: *Deleted

## 2015-06-22 ENCOUNTER — Encounter: Payer: 59 | Attending: Family Medicine | Admitting: *Deleted

## 2015-06-22 DIAGNOSIS — Z713 Dietary counseling and surveillance: Secondary | ICD-10-CM | POA: Diagnosis not present

## 2015-06-22 DIAGNOSIS — E639 Nutritional deficiency, unspecified: Secondary | ICD-10-CM | POA: Diagnosis not present

## 2015-06-22 NOTE — Patient Instructions (Signed)
Consider counseling [facility] Address: [address], [place], [place] [address] Phone: [phone]

## 2015-06-22 NOTE — Progress Notes (Signed)
  Medical Nutrition Therapy:  Appt start time: 0900 end time:  1000.   Assessment:  Primary concerns today: Leah Reid is here for nutrition counseling.  She is a [employer] and self-referred as she "has a problem controlling my eating."  She states she was able to "do something about that" by exercising twice daily.  She states she wants a healthier eating plan.  At her most recent physical her glucose was "high", but she can't remember what her glucose was.   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  She has not been able to get her "focus and energy" back on her goals.   She focuses on her family and will fix food for them, but will not take time for herself.  She states she isn't hungry for what she fixes.  She states she eats too much during the day.  Her weight is "up and down".  She had no appetite when her dad died, then she gained more weight as she started eating again.  She wants to fix foods healthier.  She would like a plan. She grew up with her MGM who cooked higher calorie, tasteful foods, but now MGM has multiple health concerns and Leah Reid is concerned about her own health.   States she doesn't sleep well.  She is under a lot of stress at work.  She reiterated over and over she "knows what to do", but isn't motivated.  She feels if she can get back on a healthier lifestyle, her mental health will improve. She wants to go back to school; Also works prn in addition to her FT job with [employer]  Is also in the process of trying to build a house.  She has a lot on her plate.  Preferred Learning Style:   No preference indicated   Learning Readiness:  Ready  MEDICATIONS: see list   DIETARY INTAKE:  Usual eating pattern includes ~1-2 meals and several snacks per day.  24-hr recall:  B ( AM): coffee, scrambled eggs with cheese and bacon (4 slices)  Snk ( AM): none  L ( PM): none Snk ( PM): doritos and water D ( PM): pizza and some gingerale Snk  ( PM): water Beverages: water, some coffee and soda  Usual physical activity: none  Estimated energy needs: 2000 calories    Nutritional Diagnosis:  NB-1.5 Disordered eating pattern As related to meal skipping and mindless eating.  As evidenced by dietary recall.    Intervention:  Nutrition counseling provided. Spent most of the visit listening and affirming.  It seems like she is not putting her own needs as a high enough priority and is stretching herself too thin.  Adding a "weight loss plan" to that mix could only serve to make things worse.  Suggested counseling for time management/stress management skills.  Encouraged her to put her needs higher on her priority list. Discussed briefly, mindful eating and encouraged her to eat without distractions.  Doesn't matter right now what she eats, but just that she does so mindfully  Teaching Method Utilized:  Auditory   Barriers to learning/adherence to lifestyle change: prioritizing   Demonstrated degree of understanding via:  Teach Back   Monitoring/Evaluation:  Dietary intake, exercise, and body weight in 1 month(s).

## 2015-07-24 ENCOUNTER — Ambulatory Visit: Payer: 59 | Admitting: *Deleted

## 2015-07-26 ENCOUNTER — Other Ambulatory Visit (HOSPITAL_BASED_OUTPATIENT_CLINIC_OR_DEPARTMENT_OTHER): Payer: Self-pay | Admitting: Gastroenterology

## 2015-07-26 DIAGNOSIS — R198 Other specified symptoms and signs involving the digestive system and abdomen: Secondary | ICD-10-CM

## 2015-07-31 ENCOUNTER — Ambulatory Visit (HOSPITAL_BASED_OUTPATIENT_CLINIC_OR_DEPARTMENT_OTHER): Payer: 59

## 2015-09-25 DIAGNOSIS — E7800 Pure hypercholesterolemia, unspecified: Secondary | ICD-10-CM | POA: Diagnosis not present

## 2015-09-25 DIAGNOSIS — I10 Essential (primary) hypertension: Secondary | ICD-10-CM | POA: Diagnosis not present

## 2015-09-25 DIAGNOSIS — R5383 Other fatigue: Secondary | ICD-10-CM | POA: Diagnosis not present

## 2015-09-28 DIAGNOSIS — E7800 Pure hypercholesterolemia, unspecified: Secondary | ICD-10-CM | POA: Diagnosis not present

## 2015-09-28 DIAGNOSIS — I10 Essential (primary) hypertension: Secondary | ICD-10-CM | POA: Diagnosis not present

## 2015-09-28 DIAGNOSIS — G43829 Menstrual migraine, not intractable, without status migrainosus: Secondary | ICD-10-CM | POA: Diagnosis not present

## 2015-09-28 DIAGNOSIS — L708 Other acne: Secondary | ICD-10-CM | POA: Diagnosis not present

## 2015-10-25 DIAGNOSIS — I10 Essential (primary) hypertension: Secondary | ICD-10-CM | POA: Diagnosis not present

## 2015-10-25 DIAGNOSIS — G43829 Menstrual migraine, not intractable, without status migrainosus: Secondary | ICD-10-CM | POA: Diagnosis not present

## 2015-10-25 DIAGNOSIS — L708 Other acne: Secondary | ICD-10-CM | POA: Diagnosis not present

## 2015-11-09 ENCOUNTER — Emergency Department (HOSPITAL_BASED_OUTPATIENT_CLINIC_OR_DEPARTMENT_OTHER)
Admission: EM | Admit: 2015-11-09 | Discharge: 2015-11-09 | Disposition: A | Discharge status: Home / Self Care | Payer: 59 | Attending: Emergency Medicine | Admitting: Emergency Medicine

## 2015-11-09 ENCOUNTER — Emergency Department (HOSPITAL_BASED_OUTPATIENT_CLINIC_OR_DEPARTMENT_OTHER): Payer: 59

## 2015-11-09 ENCOUNTER — Encounter (HOSPITAL_BASED_OUTPATIENT_CLINIC_OR_DEPARTMENT_OTHER): Payer: Self-pay | Admitting: *Deleted

## 2015-11-09 DIAGNOSIS — Z79899 Other long term (current) drug therapy: Secondary | ICD-10-CM | POA: Diagnosis not present

## 2015-11-09 DIAGNOSIS — G43829 Menstrual migraine, not intractable, without status migrainosus: Secondary | ICD-10-CM | POA: Diagnosis not present

## 2015-11-09 DIAGNOSIS — G43909 Migraine, unspecified, not intractable, without status migrainosus: Secondary | ICD-10-CM | POA: Insufficient documentation

## 2015-11-09 DIAGNOSIS — F419 Anxiety disorder, unspecified: Secondary | ICD-10-CM | POA: Insufficient documentation

## 2015-11-09 DIAGNOSIS — G43009 Migraine without aura, not intractable, without status migrainosus: Secondary | ICD-10-CM | POA: Diagnosis not present

## 2015-11-09 DIAGNOSIS — Z792 Long term (current) use of antibiotics: Secondary | ICD-10-CM | POA: Diagnosis not present

## 2015-11-09 DIAGNOSIS — Z3202 Encounter for pregnancy test, result negative: Secondary | ICD-10-CM | POA: Diagnosis not present

## 2015-11-09 DIAGNOSIS — R0789 Other chest pain: Secondary | ICD-10-CM | POA: Insufficient documentation

## 2015-11-09 DIAGNOSIS — Z8739 Personal history of other diseases of the musculoskeletal system and connective tissue: Secondary | ICD-10-CM | POA: Insufficient documentation

## 2015-11-09 DIAGNOSIS — I10 Essential (primary) hypertension: Secondary | ICD-10-CM | POA: Insufficient documentation

## 2015-11-09 DIAGNOSIS — R079 Chest pain, unspecified: Secondary | ICD-10-CM | POA: Diagnosis not present

## 2015-11-09 LAB — CBC WITH DIFFERENTIAL/PLATELET
Basophils Absolute: 0 10*3/uL (ref 0.0–0.1)
Basophils Relative: 1 %
Eosinophils Absolute: 0.3 10*3/uL (ref 0.0–0.7)
Eosinophils Relative: 4 %
HCT: 39.4 % (ref 36.0–46.0)
Hemoglobin: 13.2 g/dL (ref 12.0–15.0)
Lymphocytes Relative: 28 %
Lymphs Abs: 1.8 10*3/uL (ref 0.7–4.0)
MCH: 31.6 pg (ref 26.0–34.0)
MCHC: 33.5 g/dL (ref 30.0–36.0)
MCV: 94.3 fL (ref 78.0–100.0)
Monocytes Absolute: 0.5 10*3/uL (ref 0.1–1.0)
Monocytes Relative: 7 %
Neutro Abs: 3.8 10*3/uL (ref 1.7–7.7)
Neutrophils Relative %: 60 %
Platelets: 248 10*3/uL (ref 150–400)
RBC: 4.18 MIL/uL (ref 3.87–5.11)
RDW: 12 % (ref 11.5–15.5)
WBC: 6.4 10*3/uL (ref 4.0–10.5)

## 2015-11-09 LAB — BASIC METABOLIC PANEL
Anion gap: 7 (ref 5–15)
BUN: 12 mg/dL (ref 6–20)
CO2: 25 mmol/L (ref 22–32)
Calcium: 8.7 mg/dL — ABNORMAL LOW (ref 8.9–10.3)
Chloride: 105 mmol/L (ref 101–111)
Creatinine, Ser: 0.84 mg/dL (ref 0.44–1.00)
GFR calc Af Amer: >60 mL/min (ref >60)
GFR calc non Af Amer: >60 mL/min (ref >60)
Glucose, Bld: 111 mg/dL — ABNORMAL HIGH (ref 65–99)
Potassium: 3.9 mmol/L (ref 3.5–5.1)
Sodium: 137 mmol/L (ref 135–145)

## 2015-11-09 LAB — TROPONIN I
Troponin I: <0.03 ng/mL (ref <0.031)
Troponin I: <0.03 ng/mL (ref <0.031)

## 2015-11-09 LAB — HCG, SERUM, QUALITATIVE: Preg, Serum: NEGATIVE

## 2015-11-09 NOTE — ED Provider Notes (Signed)
CSN: [id number]     Arrival date & time 11/09/15  1112 History   First MD Initiated Contact with Patient 11/09/15 1137     Chief Complaint  Patient presents with  . Chest Pain     (Consider location/radiation/quality/duration/timing/severity/associated sxs/prior Treatment) HPI 40 year old female with history of hypertension who presents with chest pain. States that she has been in her usual state of health, and today while walking through a grocery store and developed chest pain and pressure that radiated towards her back. It varied in intensity intermittently for about 10-15 minutes, and subsequently resolved. States that she felt very anxious while this was happening, she has not had pain like this before. She did not feel short of breath, lightheaded, or have syncope. Denies any associating nausea, vomiting, diaphoresis, abdominal pain. Has not recently had cough, congestion, fevers, or other infectious symptoms. States that pain did not seem to worsen with exertion, not associated with lower extremity edema or pain. The pain does not seem to be pleuritic in nature. Currently chest pain free.   Past Medical History  Diagnosis Date  . Migraines   . Hypertension   . Arthritis    Past Surgical History  Procedure Laterality Date  . Tonsillectomy    . Wisdom tooth extraction    . Dental surgery     Family History  Problem Relation Age of Onset  . Lung cancer Father   . Hypertension Maternal Grandmother   . Hyperlipidemia Maternal Grandmother   . Stroke Maternal Grandmother   . Diabetes Maternal Grandmother    Social History  Substance Use Topics  . Smoking status: Never Smoker   . Smokeless tobacco: Never Used  . Alcohol Use: Yes     Comment: rare   OB History    No data available     Review of Systems 10/14 systems reviewed and are negative other than those stated in the HPI    Allergies  Fish allergy and Metronidazole  Home Medications   Prior to Admission  medications   Medication Sig Start Date End Date Taking? Authorizing Provider  BIOTIN 5000 PO Take by mouth.   Yes Historical Provider, MD  clindamycin (CLINDAGEL) 1 % gel Apply topically 2 (two) times daily.   Yes Historical Provider, MD  norethindrone-ethinyl estradiol 1/35 (ORTHO-NOVUM, NORTREL,CYCLAFEM) tablet Take 1 tablet by mouth daily.   Yes Historical Provider, MD  SUMAtriptan-naproxen (TREXIMET) 85-500 MG per tablet Take 1 tablet by mouth every 2 (two) hours as needed for migraine.   Yes Historical Provider, MD  amoxicillin (AMOXIL) 500 MG capsule Take 2 capsules (1,000 mg total) by mouth 2 (two) times daily. Patient not taking: Reported on 06/22/2015 04/11/14   Gregor Hams, MD  amoxicillin (AMOXIL) 500 MG capsule Take 1 capsule (500 mg total) by mouth 3 (three) times daily. Patient not taking: Reported on 06/22/2015 05/10/15   Noland Fordyce, PA-C  benzonatate (TESSALON) 100 MG capsule Take 1 capsule (100 mg total) by mouth every 8 (eight) hours. 05/10/15   Noland Fordyce, PA-C  butalbital-acetaminophen-caffeine (FIORICET, ESGIC) 50-325-40 MG per tablet Take 1 tablet by mouth 2 (two) times daily as needed.    Historical Provider, MD  ipratropium (ATROVENT) 0.06 % nasal spray Place 2 sprays into both nostrils 4 (four) times daily. Patient not taking: Reported on 06/22/2015 04/11/14   Gregor Hams, MD  Multiple Vitamin (MULTIVITAMIN) tablet Take 1 tablet by mouth daily.    Historical Provider, MD  oxymetazoline (AFRIN NASAL SPRAY) 0.05 % nasal  spray Place 1 spray into both nostrils 2 (two) times daily. No longer than 5 days Patient not taking: Reported on 06/22/2015 05/10/15   Noland Fordyce, PA-C  polyethylene glycol (MIRALAX / Floria Raveling) packet Take 17 g by mouth daily. Patient not taking: Reported on 06/22/2015 05/10/15   Noland Fordyce, PA-C  predniSONE (DELTASONE) 10 MG tablet Take 3 tablets (30 mg total) by mouth daily. Patient not taking: Reported on 06/22/2015 04/11/14   Gregor Hams, MD   SUMAtriptan [facility] 20 MG/ACT nasal spray Place 1 spray into the nose every 2 (two) hours as needed.    Historical Provider, MD  topiramate (TOPAMAX) 50 MG tablet Take 75 mg by mouth 2 (two) times daily.    Historical Provider, MD   BP 112/86 mmHg  Pulse 63  Temp(Src) 98.4 F (36.9 C) (Oral)  Resp 18  Ht 4' 11.5" (1.511 m)  Wt 165 lb (74.844 kg)  BMI 32.78 kg/m2  SpO2 100%  LMP 10/10/2015 Physical Exam Physical Exam  Nursing note and vitals reviewed. Constitutional: Well developed, well nourished, non-toxic, and in no acute distress Head: Normocephalic and atraumatic.  Mouth/Throat: Oropharynx is clear and moist.  Neck: Normal range of motion. Neck supple.  Cardiovascular: Normal rate and regular rhythm.  No edema. Pulmonary/Chest: Effort normal and breath sounds normal.  Abdominal: Soft. There is no tenderness. There is no rebound and no guarding.  Musculoskeletal: Normal range of motion.  Neurological: Alert, no facial droop, fluent speech, moves all extremities symmetrically Skin: Skin is warm and dry.  Psychiatric: Cooperative  ED Course  Procedures (including critical care time) Labs Review Labs Reviewed  BASIC METABOLIC PANEL - Abnormal; Notable for the following:    Glucose, Bld 111 (*)    Calcium 8.7 (*)    All other components within normal limits  TROPONIN I  CBC WITH DIFFERENTIAL/PLATELET  HCG, SERUM, QUALITATIVE  TROPONIN I    Imaging Review Dg Chest 2 View  11/09/2015  CLINICAL DATA:  Chest pressure starting today.  Chest pain. EXAM: CHEST  2 VIEW COMPARISON:  09/14/2012 FINDINGS: 17 degrees of dextroconvex thoracic scoliosis between T6 is and T11. Cysts The lungs appear clear. Cardiac and mediastinal contours normal. No pleural effusion identified. IMPRESSION: 1. No acute thoracic findings. 2. 17 degrees dextroconvex thoracic scoliosis. Electronically Signed   By: Van Clines M.D.   On: 11/09/2015 12:09   I have personally reviewed and evaluated  these images and lab results as part of my medical decision-making.   EKG Interpretation   Date/Time:  Thursday November 09 2015 11:22:51 EST Ventricular Rate:  76 PR Interval:  122 QRS Duration: 74 QT Interval:  372 QTC Calculation: 418 R Axis:   14 Text Interpretation:  Normal sinus rhythm Low voltage QRS Borderline ECG  No ischemic changes. No prior EKG for comparison  Confirmed by LIU MD,  DANA [phone]) on 11/09/2015 11:42:30 AM      MDM   Final diagnoses:  Atypical chest pain    40 year old female with history of HTN who presents with atypical chest pain. Is chest pain free on arrival. VS stable. Unremarkable cardiopulmonary exam. EKG not acutely ischemic. Serial troponins negative. Low risk for ACS, and felt adequately ruled out. No prior history of chest pain or exertional symptoms. CXR without infiltrate, edema, PTX, widened mediastinum or other acute cardiopulmonary processes. PERC negative and atypical symptoms for that of PE. Low suspicion for serious or toxic etiologies of chest pain at this time. Safe for discharge  home. Strict return and follow-up instructions reviewed. She expressed understanding of all discharge instructions and felt comfortable with the plan of care.     Forde Dandy, MD 11/09/15 [phone]

## 2015-11-09 NOTE — Discharge Instructions (Signed)
We did not find serious cause of your chest pain today. Return without fail for worsening symptoms, including recurrent or worsening pain, difficulty breathing, passing out, or any other symptoms concerning to you.  Nonspecific Chest Pain  Chest pain can be caused by many different conditions. There is always a chance that your pain could be related to something serious, such as a heart attack or a blood clot in your lungs. Chest pain can also be caused by conditions that are not life-threatening. If you have chest pain, it is very important to follow up with your health care provider. CAUSES  Chest pain can be caused by:  Heartburn.  Pneumonia or bronchitis.  Anxiety or stress.  Inflammation around your heart (pericarditis) or lung (pleuritis or pleurisy).  A blood clot in your lung.  A collapsed lung (pneumothorax). It can develop suddenly on its own (spontaneous pneumothorax) or from trauma to the chest.  Shingles infection (varicella-zoster virus).  Heart attack.  Damage to the bones, muscles, and cartilage that make up your chest wall. This can include:  Bruised bones due to injury.  Strained muscles or cartilage due to frequent or repeated coughing or overwork.  Fracture to one or more ribs.  Sore cartilage due to inflammation (costochondritis). RISK FACTORS  Risk factors for chest pain may include:  Activities that increase your risk for trauma or injury to your chest.  Respiratory infections or conditions that cause frequent coughing.  Medical conditions or overeating that can cause heartburn.  Heart disease or family history of heart disease.  Conditions or health behaviors that increase your risk of developing a blood clot.  Having had chicken pox (varicella zoster). SIGNS AND SYMPTOMS Chest pain can feel like:  Burning or tingling on the surface of your chest or deep in your chest.  Crushing, pressure, aching, or squeezing pain.  Dull or sharp pain  that is worse when you move, cough, or take a deep breath.  Pain that is also felt in your back, neck, shoulder, or arm, or pain that spreads to any of these areas. Your chest pain may come and go, or it may stay constant. DIAGNOSIS Lab tests or other studies may be needed to find the cause of your pain. Your health care provider may have you take a test called an ambulatory ECG (electrocardiogram). An ECG records your heartbeat patterns at the time the test is performed. You may also have other tests, such as:  Transthoracic echocardiogram (TTE). During echocardiography, sound waves are used to create a picture of all of the heart structures and to look at how blood flows through your heart.  Transesophageal echocardiogram (TEE).This is a more advanced imaging test that obtains images from inside your body. It allows your health care provider to see your heart in finer detail.  Cardiac monitoring. This allows your health care provider to monitor your heart rate and rhythm in real time.  Holter monitor. This is a portable device that records your heartbeat and can help to diagnose abnormal heartbeats. It allows your health care provider to track your heart activity for several days, if needed.  Stress tests. These can be done through exercise or by taking medicine that makes your heart beat more quickly.  Blood tests.  Imaging tests. TREATMENT  Your treatment depends on what is causing your chest pain. Treatment may include:  Medicines. These may include:  Acid blockers for heartburn.  Anti-inflammatory medicine.  Pain medicine for inflammatory conditions.  Antibiotic medicine, if an  infection is present.  Medicines to dissolve blood clots.  Medicines to treat coronary artery disease.  Supportive care for conditions that do not require medicines. This may include:  Resting.  Applying heat or cold packs to injured areas.  Limiting activities until pain decreases. HOME CARE  INSTRUCTIONS  If you were prescribed an antibiotic medicine, finish it all even if you start to feel better.  Avoid any activities that bring on chest pain.  Do not use any tobacco products, including cigarettes, chewing tobacco, or electronic cigarettes. If you need help quitting, ask your health care provider.  Do not drink alcohol.  Take medicines only as directed by your health care provider.  Keep all follow-up visits as directed by your health care provider. This is important. This includes any further testing if your chest pain does not go away.  If heartburn is the cause for your chest pain, you may be told to keep your head raised (elevated) while sleeping. This reduces the chance that acid will go from your stomach into your esophagus.  Make lifestyle changes as directed by your health care provider. These may include:  Getting regular exercise. Ask your health care provider to suggest some activities that are safe for you.  Eating a heart-healthy diet. A registered dietitian can help you to learn healthy eating options.  Maintaining a healthy weight.  Managing diabetes, if necessary.  Reducing stress. SEEK MEDICAL CARE IF:  Your chest pain does not go away after treatment.  You have a rash with blisters on your chest.  You have a fever. SEEK IMMEDIATE MEDICAL CARE IF:   Your chest pain is worse.  You have an increasing cough, or you cough up blood.  You have severe abdominal pain.  You have severe weakness.  You faint.  You have chills.  You have sudden, unexplained chest discomfort.  You have sudden, unexplained discomfort in your arms, back, neck, or jaw.  You have shortness of breath at any time.  You suddenly start to sweat, or your skin gets clammy.  You feel nauseous or you vomit.  You suddenly feel light-headed or dizzy.  Your heart begins to beat quickly, or it feels like it is skipping beats. These symptoms may represent a serious  problem that is an emergency. Do not wait to see if the symptoms will go away. Get medical help right away. Call your local emergency services (911 in the [place] Do not drive yourself to the hospital.   This information is not intended to replace advice given to you by your health care provider. Make sure you discuss any questions you have with your health care provider.   Document Released: 06/05/2005 Document Revised: 09/16/2014 Document Reviewed: 04/01/2014 Elsevier Interactive Patient Education [employer].

## 2015-11-09 NOTE — ED Notes (Signed)
Patient transported to X-ray 

## 2015-11-09 NOTE — ED Notes (Signed)
Patient transported to and from radiology department via stretcher. 

## 2015-11-09 NOTE — ED Notes (Signed)
Pt reports sudden onset of chest pain "like bricks on my chest" while walking around [place] [employer] 30 minutes pta. States has been experiencing "nagging" left chest pain x 3 weeks

## 2015-11-09 NOTE — ED Notes (Signed)
Patient returned from xray.

## 2015-11-18 ENCOUNTER — Ambulatory Visit (HOSPITAL_BASED_OUTPATIENT_CLINIC_OR_DEPARTMENT_OTHER)
Admission: RE | Admit: 2015-11-18 | Discharge: 2015-11-18 | Disposition: A | Discharge status: Home / Self Care | Payer: 59 | Source: Ambulatory Visit | Attending: Gastroenterology | Admitting: Gastroenterology

## 2015-11-18 DIAGNOSIS — R933 Abnormal findings on diagnostic imaging of other parts of digestive tract: Secondary | ICD-10-CM | POA: Insufficient documentation

## 2015-11-18 DIAGNOSIS — R198 Other specified symptoms and signs involving the digestive system and abdomen: Secondary | ICD-10-CM

## 2015-11-18 DIAGNOSIS — K769 Liver disease, unspecified: Secondary | ICD-10-CM | POA: Insufficient documentation

## 2015-11-28 DIAGNOSIS — R0789 Other chest pain: Secondary | ICD-10-CM | POA: Diagnosis not present

## 2015-11-28 DIAGNOSIS — R0981 Nasal congestion: Secondary | ICD-10-CM | POA: Diagnosis not present

## 2015-11-29 ENCOUNTER — Emergency Department
Admission: EM | Admit: 2015-11-29 | Discharge: 2015-11-29 | Disposition: A | Discharge status: Home / Self Care | Payer: 59 | Source: Home / Self Care | Attending: Family Medicine | Admitting: Family Medicine

## 2015-11-29 ENCOUNTER — Encounter: Payer: Self-pay | Admitting: *Deleted

## 2015-11-29 ENCOUNTER — Emergency Department (INDEPENDENT_AMBULATORY_CARE_PROVIDER_SITE_OTHER): Payer: 59

## 2015-11-29 DIAGNOSIS — M5412 Radiculopathy, cervical region: Secondary | ICD-10-CM | POA: Diagnosis not present

## 2015-11-29 DIAGNOSIS — M4802 Spinal stenosis, cervical region: Secondary | ICD-10-CM | POA: Diagnosis not present

## 2015-11-29 DIAGNOSIS — M542 Cervicalgia: Secondary | ICD-10-CM | POA: Diagnosis not present

## 2015-11-29 MED ORDER — CERVICAL COLLAR ADJUSTABLE MISC: Status: DC

## 2015-11-29 MED ORDER — CYCLOBENZAPRINE HCL 10 MG PO TABS: 10.0000 mg | ORAL_TABLET | Freq: Three times a day (TID) | ORAL | Status: DC

## 2015-11-29 MED ORDER — HYDROCODONE-ACETAMINOPHEN 5-325 MG PO TABS: ORAL_TABLET | ORAL | Status: DC

## 2015-11-29 MED ORDER — PREDNISONE 20 MG PO TABS
ORAL_TABLET | ORAL | Status: DC
Start: 2015-11-29 — End: 2016-11-28

## 2015-11-29 NOTE — ED Provider Notes (Signed)
CSN: [id number]     Arrival date & time 11/29/15  1833 History   First MD Initiated Contact with Patient 11/29/15 1952     Chief Complaint  Patient presents with  . Neck Pain      HPI Comments: Patient awoke with left neck pain and decreased range of motion today.  She recalls no injury.  Her pain has not responded to Ibuprofen 800mg .  The history is provided by the patient.    Past Medical History  Diagnosis Date  . Migraines   . Hypertension   . Arthritis    Past Surgical History  Procedure Laterality Date  . Tonsillectomy    . Wisdom tooth extraction    . Dental surgery     Family History  Problem Relation Age of Onset  . Lung cancer Father   . Hypertension Maternal Grandmother   . Hyperlipidemia Maternal Grandmother   . Stroke Maternal Grandmother   . Diabetes Maternal Grandmother    Social History  Substance Use Topics  . Smoking status: Never Smoker   . Smokeless tobacco: Never Used  . Alcohol Use: Yes     Comment: rare   OB History    No data available     Review of Systems  Constitutional: Negative.   HENT: Negative.   Eyes: Negative.   Respiratory: Negative.   Cardiovascular: Negative.   Genitourinary: Negative.   Musculoskeletal: Positive for neck pain and neck stiffness. Negative for back pain.  Skin: Negative.   Neurological: Negative for headaches.    Allergies  Fish allergy and Metronidazole  Home Medications   Prior to Admission medications   Medication Sig Start Date End Date Taking? Authorizing Provider  norethindrone-ethinyl estradiol 1/35 (ORTHO-NOVUM, NORTREL,CYCLAFEM) tablet Take 1 tablet by mouth daily.   Yes Historical Provider, MD  BIOTIN 5000 PO Take by mouth.    Historical Provider, MD  butalbital-acetaminophen-caffeine (FIORICET, ESGIC) 50-325-40 MG per tablet Take 1 tablet by mouth 2 (two) times daily as needed.    Historical Provider, MD  clindamycin (CLINDAGEL) 1 % gel Apply topically 2 (two) times daily.    Historical  Provider, MD  cyclobenzaprine (FLEXERIL) 10 MG tablet Take 1 tablet (10 mg total) by mouth 3 (three) times daily. 11/29/15   Kandra Nicolas, MD  Elastic Bandages & Supports (CERVICAL COLLAR ADJUSTABLE) MISC Wear several hours daily 11/29/15   Kandra Nicolas, MD  HYDROcodone-acetaminophen (NORCO/VICODIN) 5-325 MG tablet Take one by mouth at bedtime as needed for pain 11/29/15   Kandra Nicolas, MD  Multiple Vitamin (MULTIVITAMIN) tablet Take 1 tablet by mouth daily.    Historical Provider, MD  predniSONE (DELTASONE) 20 MG tablet Take one tab by mouth twice daily for 5 days, then one daily for 3 days. Take with food. 11/29/15   Kandra Nicolas, MD  SUMAtriptan (IMITREX) 20 MG/ACT nasal spray Place 1 spray into the nose every 2 (two) hours as needed.    Historical Provider, MD  SUMAtriptan-naproxen (TREXIMET) 85-500 MG per tablet Take 1 tablet by mouth every 2 (two) hours as needed for migraine.    Historical Provider, MD  topiramate (TOPAMAX) 50 MG tablet Take 75 mg by mouth 2 (two) times daily.    Historical Provider, MD   Meds Ordered and Administered this Visit  Medications - No data to display  BP 144/95 mmHg  Pulse 71  Temp(Src) 98.3 F (36.8 C) (Oral)  Resp 16  Ht 5' (1.524 m)  Wt 177 lb (80.287 kg)  BMI 34.57 kg/m2  SpO2 100%  LMP 10/10/2015 No data found.   Physical Exam  Constitutional: She is oriented to person, place, and time. She appears well-developed and well-nourished. No distress.  HENT:  Head: Normocephalic and atraumatic.  Right Ear: External ear normal.  Left Ear: External ear normal.  Nose: Nose normal.  Mouth/Throat: Oropharynx is clear and moist.  Eyes: Conjunctivae are normal. Pupils are equal, round, and reactive to light.  Neck: Neck supple. Muscular tenderness present. No spinous process tenderness present. No rigidity. Decreased range of motion present. No erythema present. No thyromegaly present.    Left neck and trapezius muscle have tenderness to  palpation as noted on diagram.  Distal neurovascular function is intact.     Cardiovascular: Normal heart sounds.   Pulmonary/Chest: Breath sounds normal.  Abdominal: There is no tenderness.  Musculoskeletal: She exhibits no edema.  Lymphadenopathy:    She has no cervical adenopathy.  Neurological: She is alert and oriented to person, place, and time.  Skin: Skin is warm and dry. No rash noted.  Nursing note and vitals reviewed.   ED Course  Procedures none   Imaging Review Dg Cervical Spine Complete  11/29/2015  CLINICAL DATA:  Neck pain and stiffness EXAM: CERVICAL SPINE - COMPLETE 4+ VIEW COMPARISON:  None. FINDINGS: Frontal, lateral, open-mouth odontoid, and bilateral oblique views were obtained. There is no fracture or spondylolisthesis. Prevertebral soft tissues and predental space regions are normal. There is moderate disc space narrowing at C3-4 with slightly milder narrowing of the discs at C4-5 and C5-6. There is no appreciable exit foraminal narrowing on the oblique views. There is reversal of lordotic curvature. IMPRESSION: Disc space narrowing at several levels, most notably at C3-4. No fracture or spondylolisthesis. Reversal of lordotic curvature most likely is indicative of muscle spasm. Electronically Signed   By: Lowella Grip III M.D.   On: 11/29/2015 20:39      MDM   1. Cervical radiculopathy    Rx for soft cervical collar. Begin prednisone burst/taper.  Flexeril 10mg  TID.  Lortab HS prn Apply ice pack for 20 to 30 minutes, 3 to 4 times daily  Continue until pain decreases.  Wear cervical collar for several hours daily Followup with Dr. Aundria Mems or Dr. Lynne Leader ([facility]) in one week.    Kandra Nicolas, MD 12/07/15 [phone]

## 2015-11-29 NOTE — ED Notes (Signed)
Pt c/o LT sided neck spasms x today. Denies injury. She reports decreased ROM. She took Advil 800 mg at 1615.

## 2015-11-29 NOTE — Discharge Instructions (Signed)
Apply ice pack for 20 to 30 minutes, 3 to 4 times daily  Continue until pain decreases.  Wear cervical collar for several hours daily   Cervical Radiculopathy Cervical radiculopathy happens when a nerve in the neck (cervical nerve) is pinched or bruised. This condition can develop because of an injury or as part of the normal aging process. Pressure on the cervical nerves can cause pain or numbness that runs from the neck all the way down into the arm and fingers. Usually, this condition gets better with rest. Treatment may be needed if the condition does not improve.  CAUSES This condition may be caused by:  Injury.  Slipped (herniated) disk.  Muscle tightness in the neck because of overuse.  Arthritis.  Breakdown or degeneration in the bones and joints of the spine (spondylosis) due to aging.  Bone spurs that may develop near the cervical nerves. SYMPTOMS Symptoms of this condition include:  Pain that runs from the neck to the arm and hand. The pain can be severe or irritating. It may be worse when the neck is moved.  Numbness or weakness in the affected arm and hand. DIAGNOSIS This condition may be diagnosed based on symptoms, medical history, and a physical exam. You may also have tests, including:  X-rays.  CT scan.  MRI.  Electromyogram (EMG).  Nerve conduction tests. TREATMENT In many cases, treatment is not needed for this condition. With rest, the condition usually gets better over time. If treatment is needed, options may include:  Wearing a soft neck collar for short periods of time.  Physical therapy to strengthen your neck muscles.  Medicines, such as NSAIDs, oral corticosteroids, or spinal injections.  Surgery. This may be needed if other treatments do not help. Various types of surgery may be done depending on the cause of your problems. HOME CARE INSTRUCTIONS Managing Pain  Take over-the-counter and prescription medicines only as told by your health  care provider.  If directed, apply ice to the affected area.  Put ice in a plastic bag.  Place a towel between your skin and the bag.  Leave the ice on for 20 minutes, 2-3 times per day.  If ice does not help, you can try using heat. Take a warm shower or warm bath, or use a heat pack as told by your health care provider.  Try a gentle neck and shoulder massage to help relieve symptoms. Activity  Rest as needed. Follow instructions from your health care provider about any restrictions on activities.  Do stretching and strengthening exercises as told by your health care provider or physical therapist. General Instructions  If you were given a soft collar, wear it as told by your health care provider.  Use a flat pillow when you sleep.  Keep all follow-up visits as told by your health care provider. This is important. SEEK MEDICAL CARE IF:  Your condition does not improve with treatment. SEEK IMMEDIATE MEDICAL CARE IF:  Your pain gets much worse and cannot be controlled with medicines.  You have weakness or numbness in your hand, arm, face, or leg.  You have a high fever.  You have a stiff, rigid neck.  You lose control of your bowels or your bladder (have incontinence).  You have trouble with walking, balance, or speaking.   This information is not intended to replace advice given to you by your health care provider. Make sure you discuss any questions you have with your health care provider.   Document Released:  05/21/2001 Document Revised: 05/17/2015 Document Reviewed: 10/20/2014 Elsevier Interactive Patient Education [employer].

## 2015-12-12 DIAGNOSIS — R0789 Other chest pain: Secondary | ICD-10-CM | POA: Diagnosis not present

## 2015-12-26 DIAGNOSIS — R0789 Other chest pain: Secondary | ICD-10-CM | POA: Diagnosis not present

## 2016-03-11 DIAGNOSIS — H5203 Hypermetropia, bilateral: Secondary | ICD-10-CM | POA: Diagnosis not present

## 2016-05-20 DIAGNOSIS — G43009 Migraine without aura, not intractable, without status migrainosus: Secondary | ICD-10-CM | POA: Diagnosis not present

## 2016-05-20 DIAGNOSIS — G43829 Menstrual migraine, not intractable, without status migrainosus: Secondary | ICD-10-CM | POA: Diagnosis not present

## 2016-06-28 DIAGNOSIS — H1132 Conjunctival hemorrhage, left eye: Secondary | ICD-10-CM | POA: Diagnosis not present

## 2016-08-05 ENCOUNTER — Encounter: Payer: Self-pay | Admitting: Emergency Medicine

## 2016-08-05 ENCOUNTER — Emergency Department (INDEPENDENT_AMBULATORY_CARE_PROVIDER_SITE_OTHER)
Admission: EM | Admit: 2016-08-05 | Discharge: 2016-08-05 | Disposition: A | Discharge status: Home / Self Care | Payer: 59 | Source: Home / Self Care

## 2016-08-05 DIAGNOSIS — Z23 Encounter for immunization: Secondary | ICD-10-CM

## 2016-08-05 DIAGNOSIS — Z111 Encounter for screening for respiratory tuberculosis: Secondary | ICD-10-CM

## 2016-08-05 MED ORDER — TETANUS-DIPHTH-ACELL PERTUSSIS 5-2.5-18.5 LF-MCG/0.5 IM SUSP
0.5000 mL | Freq: Once | INTRAMUSCULAR | Status: AC
  Administered 2016-08-05: 0.5 mL via INTRAMUSCULAR

## 2016-08-05 MED ORDER — TUBERCULIN PPD 5 UNIT/0.1ML ID SOLN
5.0000 [IU] | Freq: Once | INTRADERMAL | Status: DC
  Administered 2016-08-05: 5 [IU] via INTRADERMAL

## 2016-08-05 NOTE — ED Triage Notes (Signed)
Patient desires Tdap and TB skin test.

## 2016-08-06 DIAGNOSIS — E785 Hyperlipidemia, unspecified: Secondary | ICD-10-CM | POA: Diagnosis not present

## 2016-08-06 DIAGNOSIS — Z1231 Encounter for screening mammogram for malignant neoplasm of breast: Secondary | ICD-10-CM | POA: Diagnosis not present

## 2016-08-06 DIAGNOSIS — Z0000 Encounter for general adult medical examination without abnormal findings: Secondary | ICD-10-CM | POA: Diagnosis not present

## 2016-08-07 DIAGNOSIS — Z23 Encounter for immunization: Secondary | ICD-10-CM | POA: Diagnosis not present

## 2016-08-19 ENCOUNTER — Emergency Department (INDEPENDENT_AMBULATORY_CARE_PROVIDER_SITE_OTHER)
Admission: EM | Admit: 2016-08-19 | Discharge: 2016-08-19 | Disposition: A | Discharge status: Home / Self Care | Payer: 59 | Source: Home / Self Care

## 2016-08-19 ENCOUNTER — Encounter: Payer: Self-pay | Admitting: *Deleted

## 2016-08-19 DIAGNOSIS — Z111 Encounter for screening for respiratory tuberculosis: Secondary | ICD-10-CM

## 2016-08-19 MED ORDER — TUBERCULIN PPD 5 UNIT/0.1ML ID SOLN
5.0000 [IU] | Freq: Once | INTRADERMAL | Status: DC
  Administered 2016-08-19: 5 [IU] via INTRADERMAL

## 2016-08-19 NOTE — ED Triage Notes (Signed)
Patient is here for 2nd PPD placement. Placed to [facility]. Copy of TB screening given to patient as she may return here for her read.

## 2016-11-28 ENCOUNTER — Encounter: Payer: Self-pay | Admitting: *Deleted

## 2016-11-28 ENCOUNTER — Encounter: Payer: Self-pay | Admitting: Family

## 2016-11-28 ENCOUNTER — Ambulatory Visit (INDEPENDENT_AMBULATORY_CARE_PROVIDER_SITE_OTHER): Payer: Self-pay | Admitting: Family

## 2016-11-28 VITALS — BP 120/90 | HR 61 | Temp 99.4°F | Wt 181.6 lb

## 2016-11-28 DIAGNOSIS — R110 Nausea: Secondary | ICD-10-CM | POA: Insufficient documentation

## 2016-11-28 DIAGNOSIS — T50905A Adverse effect of unspecified drugs, medicaments and biological substances, initial encounter: Secondary | ICD-10-CM

## 2016-11-28 DIAGNOSIS — R42 Dizziness and giddiness: Secondary | ICD-10-CM | POA: Insufficient documentation

## 2016-11-28 MED ORDER — ONDANSETRON HCL 4 MG PO TABS: 4.0000 mg | ORAL_TABLET | Freq: Three times a day (TID) | ORAL | 0 refills | Status: DC | PRN

## 2016-11-28 NOTE — Progress Notes (Signed)
Leah Reid  Chief Complaint  Patient presents with  . Medication Management    topamax      ICD-9-CM ICD-10-CM   1. Nausea 787.02 R11.0 ondansetron (ZOFRAN) 4 MG tablet  2. Vertigo 780.4 R42     Patient Active Problem List   Diagnosis Date Noted  . Nausea 11/28/2016  . Vertigo 11/28/2016  . ACUTE MAXILLARY SINUSITIS 06/11/2011  . ACUTE PHARYNGITIS 06/11/2011  . DERMATOPHYTOSIS OF THE BODY 10/28/2008  . CELLULITIS, GROIN, LEFT 10/28/2008  . [place] SYNDROME 10/17/2008  . HYPERBILIRUBINEMIA 10/17/2008  . BENIGN NEOPLASM OF LIVER AND BILIARY PASSAGES 10/07/2008  . HYPERCHOLESTEROLEMIA 10/07/2008  . COMMON MIGRAINE 10/07/2008  . GERD 10/07/2008  . CONSTIPATION, CHRONIC 10/07/2008  . OSTEOARTHRITIS, GENERALIZED, MULTIPLE JOINTS 10/07/2008  . FIBROMYALGIA 10/07/2008  . ABDOMINAL PAIN, CHRONIC 10/07/2008  . HYPERTENSION 09/05/2008  . FOLLICULITIS 29/52/8413    Past Medical History:  Diagnosis Date  . Arthritis   . Hypertension   . Migraines     Past Surgical History:  Procedure Laterality Date  . DENTAL SURGERY    . TONSILLECTOMY    . WISDOM TOOTH EXTRACTION      Allergies  Allergen Reactions  . Fish Allergy   . Metronidazole     BP 120/90 (BP Location: Left Arm, Patient Position: Sitting, Cuff Size: Normal)   Pulse 61   Temp 99.4 F (37.4 C) (Oral)   Wt 181 lb 9.6 oz (82.4 kg)   LMP 11/19/2016 (Approximate)   SpO2 99%   BMI 35.47 kg/m   Review of Systems  Constitutional: Positive for malaise/fatigue.  Gastrointestinal: Positive for nausea. Negative for abdominal pain, blood in stool, constipation, diarrhea, heartburn, melena and vomiting.  Neurological: Positive for dizziness and sensory change (tingling in fingers after restarting Topamax at 200mg  after long hiatus).  All other systems reviewed and are negative.  Physical Exam  Constitutional: She is oriented to person, place, and time. She appears well-developed and well-nourished.  HENT:   Head: Normocephalic and atraumatic.  Eyes: Conjunctivae are normal.  Neck: Normal range of motion.  Cardiovascular: Normal rate, regular rhythm and normal heart sounds.   Pulmonary/Chest: Effort normal and breath sounds normal.  Abdominal: Soft. Bowel sounds are normal.  Musculoskeletal: Normal range of motion.  Neurological: She is alert and oriented to person, place, and time.  Skin: Skin is warm and dry. Capillary refill takes less than 2 seconds.  Psychiatric: She has a normal mood and affect.    No results found for this or any previous visit (from the past 72 hour(s)).   Current Outpatient Prescriptions:  .  b complex vitamins tablet, Take 1 tablet by mouth daily., Disp: , Rfl:  .  calcium citrate-vitamin D (CITRACAL+D) 315-200 MG-UNIT tablet, Take 1 tablet by mouth 2 (two) times daily., Disp: , Rfl:  .  clindamycin (CLINDAGEL) 1 % gel, Apply topically 2 (two) times daily., Disp: , Rfl:  .  Multiple Vitamin (MULTIVITAMIN) tablet, Take 1 tablet by mouth daily., Disp: , Rfl:  .  norethindrone-ethinyl estradiol 1/35 (ORTHO-NOVUM, NORTREL,CYCLAFEM) tablet, Take 1 tablet by mouth daily., Disp: , Rfl:  .  OVER THE COUNTER MEDICATION, keratin, Disp: , Rfl:  .  topiramate (TOPAMAX) 100 MG tablet, Take 100 mg by mouth 2 (two) times daily., Disp: , Rfl:  .  UNABLE TO FIND, Med Name: burdock root, Disp: , Rfl:  .  zinc gluconate 50 MG tablet, Take 50 mg by mouth daily., Disp: , Rfl:  .  ondansetron (ZOFRAN) 4  MG tablet, Take 1 tablet (4 mg total) by mouth every 8 (eight) hours as needed for nausea or vomiting., Disp: 30 tablet, Rfl: 0  Subjective: "Changed birth control 8 days ago and restarted Topamax 200mg  daily (100mg  po bid) after being off for a long time, taking that for headaches only. Switched from a biphasic to a triphasic birth control at the same time. Feeling dizzy, nauseous, hard to focus at nursing classes. Mild tingling at times in fingers but this happened last time on  Topamax. Last time they started me 50, 100, 200. Now they started me on 200 right away"  Objective: Pt seen and chart reviewed. Pt is alert, oriented x3, calm, cooperative, in NAD. No change or loss of sensation. Pt has no objective symptoms today, all WNL.   Assessment:  -Nausea - Zofran 4mg  po q8h prn nausea/vomiting #30 -Adverse Effect of Drug (topamax); reduce to 50mg  po bid instead of 100mg  po bid; OK to cut the instant release tabs in half; pt to call her neurologist and advise them of the plan and reactions; (pt was told it would be a long time to get an appointment so she came here) -Vertigo (see above for topamax dose)  Plan: See above details  Benjamine Mola, FNP 11/28/2016 12:31 PM

## 2016-12-06 ENCOUNTER — Encounter: Payer: Self-pay | Admitting: Family

## 2016-12-06 ENCOUNTER — Ambulatory Visit (INDEPENDENT_AMBULATORY_CARE_PROVIDER_SITE_OTHER): Payer: Self-pay | Admitting: Family

## 2016-12-06 VITALS — BP 122/82 | HR 75 | Temp 98.2°F | Wt 183.6 lb

## 2016-12-06 DIAGNOSIS — K047 Periapical abscess without sinus: Secondary | ICD-10-CM | POA: Insufficient documentation

## 2016-12-06 MED ORDER — AMOXICILLIN 500 MG PO CAPS: 500.0000 mg | ORAL_CAPSULE | Freq: Three times a day (TID) | ORAL | 0 refills | Status: DC

## 2016-12-06 NOTE — Progress Notes (Signed)
Leah Reid  No chief complaint on file.     ICD-9-CM ICD-10-CM   1. Dental abscess 522.5 K04.7 amoxicillin (AMOXIL) 500 MG capsule    Patient Active Problem List   Diagnosis Date Noted  . Dental abscess 12/06/2016  . Nausea 11/28/2016  . Vertigo 11/28/2016  . Drug reaction 11/28/2016  . ACUTE MAXILLARY SINUSITIS 06/11/2011  . ACUTE PHARYNGITIS 06/11/2011  . DERMATOPHYTOSIS OF THE BODY 10/28/2008  . CELLULITIS, GROIN, LEFT 10/28/2008  . [place] SYNDROME 10/17/2008  . HYPERBILIRUBINEMIA 10/17/2008  . BENIGN NEOPLASM OF LIVER AND BILIARY PASSAGES 10/07/2008  . HYPERCHOLESTEROLEMIA 10/07/2008  . COMMON MIGRAINE 10/07/2008  . GERD 10/07/2008  . CONSTIPATION, CHRONIC 10/07/2008  . OSTEOARTHRITIS, GENERALIZED, MULTIPLE JOINTS 10/07/2008  . FIBROMYALGIA 10/07/2008  . ABDOMINAL PAIN, CHRONIC 10/07/2008  . HYPERTENSION 09/05/2008  . FOLLICULITIS 20/94/7096    Past Medical History:  Diagnosis Date  . Arthritis   . Hypertension   . Migraines     Past Surgical History:  Procedure Laterality Date  . DENTAL SURGERY    . TONSILLECTOMY    . WISDOM TOOTH EXTRACTION      Allergies  Allergen Reactions  . Fish Allergy   . Metronidazole     BP 122/82 (BP Location: Right Arm, Patient Position: Sitting, Cuff Size: Normal)   Pulse 75   Temp 98.2 F (36.8 C) (Oral)   Wt 183 lb 9.6 oz (83.3 kg)   LMP 11/19/2016 (Approximate)   SpO2 98%   BMI 35.86 kg/m   Review of Systems  Constitutional: Positive for malaise/fatigue.  HENT:       Some mouth tenderness and green drainage  All other systems reviewed and are negative.  Physical Exam  Constitutional: She is oriented to person, place, and time. She appears well-developed and well-nourished.  HENT:  Head: Normocephalic and atraumatic.  Mouth/Throat: Oropharynx is clear and moist and mucous membranes are normal. Dental abscesses (mild facial swelling, right upper cheek, mild erythema, green exudate to LU molar region)  present. Tonsils are 0 on the right. Tonsils are 0 on the left. No tonsillar exudate.    Eyes: Conjunctivae are normal.  Neck: Normal range of motion.  Cardiovascular: Normal rate, regular rhythm and normal heart sounds.   Pulmonary/Chest: Effort normal and breath sounds normal.  Abdominal: Soft. Bowel sounds are normal.  Musculoskeletal: Normal range of motion.  Neurological: She is alert and oriented to person, place, and time.  Skin: Skin is warm and dry. Capillary refill takes less than 2 seconds.  Psychiatric: She has a normal mood and affect. Her behavior is normal.  Vitals reviewed.   No results found for this or any previous visit (from the past 72 hour(s)).   Current Outpatient Prescriptions:  .  b complex vitamins tablet, Take 1 tablet by mouth daily., Disp: , Rfl:  .  calcium citrate-vitamin D (CITRACAL+D) 315-200 MG-UNIT tablet, Take 1 tablet by mouth 2 (two) times daily., Disp: , Rfl:  .  clindamycin (CLINDAGEL) 1 % gel, Apply topically 2 (two) times daily., Disp: , Rfl:  .  Milk Thistle 200 MG CAPS, Take by mouth., Disp: , Rfl:  .  Multiple Vitamin (MULTIVITAMIN) tablet, Take 1 tablet by mouth daily., Disp: , Rfl:  .  norethindrone-ethinyl estradiol 1/35 (ORTHO-NOVUM, NORTREL,CYCLAFEM) tablet, Take 1 tablet by mouth daily., Disp: , Rfl:  .  ondansetron (ZOFRAN) 4 MG tablet, Take 1 tablet (4 mg total) by mouth every 8 (eight) hours as needed for nausea or vomiting., Disp: 30 tablet,  Rfl: 0 .  OVER THE COUNTER MEDICATION, keratin, Disp: , Rfl:  .  zinc gluconate 50 MG tablet, Take 50 mg by mouth daily., Disp: , Rfl:  .  amoxicillin (AMOXIL) 500 MG capsule, Take 1 capsule (500 mg total) by mouth every 8 (eight) hours., Disp: 30 capsule, Rfl: 0 .  topiramate (TOPAMAX) 100 MG tablet, Take 100 mg by mouth 2 (two) times daily., Disp: , Rfl:   Subjective: "For the past few days had some facial swelling, then some green drainage coming out of my mouth. I called the dentist and he  said to come in yesterday but I couldn't make it. I can see him early next week and he will fit me in. I know it's infected."  Objective: Pt seen and chart reviewed. Pt is alert, oriented x3, calm, cooperative, in NAD. Mild TTP left upper facial region. Mild erythema in oral mucosa surrounding molar as shown in charting, scant green exudate; pt reports it was a lot more.  Assessment:  -Dental Abscess  Plan: Amoxicillin 500mg  po q8h x 10 days #30, Ibuprofen OTC 600mg  q4h prn pain/swelling  Benjamine Mola, FNP 12/06/2016 7:13 PM

## 2017-09-19 ENCOUNTER — Other Ambulatory Visit (HOSPITAL_BASED_OUTPATIENT_CLINIC_OR_DEPARTMENT_OTHER): Payer: Self-pay | Admitting: Gastroenterology

## 2017-09-19 DIAGNOSIS — R933 Abnormal findings on diagnostic imaging of other parts of digestive tract: Secondary | ICD-10-CM

## 2017-09-24 ENCOUNTER — Ambulatory Visit (HOSPITAL_BASED_OUTPATIENT_CLINIC_OR_DEPARTMENT_OTHER)
Admission: RE | Admit: 2017-09-24 | Discharge: 2017-09-24 | Disposition: A | Discharge status: Home / Self Care | Payer: Medicaid Other | Source: Ambulatory Visit | Attending: Gastroenterology | Admitting: Gastroenterology

## 2017-09-24 DIAGNOSIS — R933 Abnormal findings on diagnostic imaging of other parts of digestive tract: Secondary | ICD-10-CM | POA: Diagnosis present

## 2017-10-26 ENCOUNTER — Emergency Department
Admission: EM | Admit: 2017-10-26 | Discharge: 2017-10-26 | Disposition: A | Discharge status: Home / Self Care | Payer: Medicaid Other | Source: Home / Self Care | Attending: Family Medicine | Admitting: Family Medicine

## 2017-10-26 ENCOUNTER — Encounter: Payer: Self-pay | Admitting: Emergency Medicine

## 2017-10-26 ENCOUNTER — Other Ambulatory Visit: Payer: Self-pay

## 2017-10-26 DIAGNOSIS — J069 Acute upper respiratory infection, unspecified: Secondary | ICD-10-CM | POA: Diagnosis not present

## 2017-10-26 MED ORDER — AMOXICILLIN 875 MG PO TABS: 875.0000 mg | ORAL_TABLET | Freq: Two times a day (BID) | ORAL | 0 refills | Status: DC

## 2017-10-26 MED ORDER — PREDNISONE 20 MG PO TABS: ORAL_TABLET | ORAL | 0 refills | Status: DC

## 2017-10-26 NOTE — ED Triage Notes (Signed)
Nasal congestion, runny nose facial pain and pressure x 3 days

## 2017-10-26 NOTE — Discharge Instructions (Signed)
Take plain guaifenesin (1200mg  extended release tabs such as Mucinex) twice daily, with plenty of water, for cough and congestion.  Continue pseudoephedrine for sinus congestion.  Get adequate rest.   May use Afrin nasal spray (or generic oxymetazoline) each morning for about 5 days and then discontinue.  Also recommend using saline nasal spray several times daily and saline nasal irrigation (AYR is a common brand).  Use Flonase nasal spray each morning after using Afrin nasal spray and saline nasal irrigation. Try warm salt water gargles for sore throat.  Stop all antihistamines for now, and other non-prescription cough/cold preparations. May take Delsym Cough Suppressant at bedtime for nighttime cough.  Begin Amoxicillin if not improving about one week or if persistent fever develops

## 2017-10-26 NOTE — ED Provider Notes (Signed)
[name]    CSN: [id number] Arrival date & time: 10/26/17  1458     History   Chief Complaint Chief Complaint  Patient presents with  . Nasal Congestion    HPI Leah Reid is a 42 y.o. female.   Patient complains of three day history of typical cold-like symptoms developing over several days, including mild sore throat, sinus congestion, sneezing, facial pressure, and fatigue.  No cough.  She has a history of sinusitis.   The history is provided by the patient.    Past Medical History:  Diagnosis Date  . Arthritis   . Hypertension   . Migraines   . Migraines     Patient Active Problem List   Diagnosis Date Noted  . Dental abscess 12/06/2016  . Nausea 11/28/2016  . Vertigo 11/28/2016  . Drug reaction 11/28/2016  . ACUTE MAXILLARY SINUSITIS 06/11/2011  . ACUTE PHARYNGITIS 06/11/2011  . DERMATOPHYTOSIS OF THE BODY 10/28/2008  . CELLULITIS, GROIN, LEFT 10/28/2008  . [place] SYNDROME 10/17/2008  . HYPERBILIRUBINEMIA 10/17/2008  . BENIGN NEOPLASM OF LIVER AND BILIARY PASSAGES 10/07/2008  . HYPERCHOLESTEROLEMIA 10/07/2008  . COMMON MIGRAINE 10/07/2008  . GERD 10/07/2008  . CONSTIPATION, CHRONIC 10/07/2008  . OSTEOARTHRITIS, GENERALIZED, MULTIPLE JOINTS 10/07/2008  . FIBROMYALGIA 10/07/2008  . ABDOMINAL PAIN, CHRONIC 10/07/2008  . HYPERTENSION 09/05/2008  . FOLLICULITIS 00/93/8182    Past Surgical History:  Procedure Laterality Date  . DENTAL SURGERY    . TONSILLECTOMY    . WISDOM TOOTH EXTRACTION      OB History    No data available       Home Medications    Prior to Admission medications   Medication Sig Start Date End Date Taking? Authorizing Provider  hydrochlorothiazide (HYDRODIURIL) 25 MG tablet Take 25 mg by mouth daily.   Yes [provider]  amoxicillin (AMOXIL) 875 MG tablet Take 1 tablet (875 mg total) by mouth 2 (two) times daily. (Rx void after 11/03/17) 10/26/17   Kandra Nicolas, MD  b complex vitamins  tablet Take 1 tablet by mouth daily.    [provider]  Multiple Vitamin (MULTIVITAMIN) tablet Take 1 tablet by mouth daily.    [provider]  norethindrone-ethinyl estradiol 1/35 (ORTHO-NOVUM, NORTREL,CYCLAFEM) tablet Take 1 tablet by mouth daily.    [provider]  OVER THE COUNTER MEDICATION keratin    [provider]  predniSONE (DELTASONE) 20 MG tablet Take one tab by mouth twice daily for 4 days, then one daily. Take with food. 10/26/17   Kandra Nicolas, MD  zinc gluconate 50 MG tablet Take 50 mg by mouth daily.    [provider]  Cetirizine HCl 10 MG CAPS Take 1 capsule (10 mg total) by mouth daily. 09/14/12 04/11/14  Deneise Lever, MD  fluticasone (FLONASE) 50 MCG/ACT nasal spray Place 2 sprays into the nose daily. 09/14/12 04/11/14  Deneise Lever, MD    Family History Family History  Problem Relation Age of Onset  . Lung cancer Father   . Hypertension Maternal Grandmother   . Hyperlipidemia Maternal Grandmother   . Stroke Maternal Grandmother   . Diabetes Maternal Grandmother     Social History Social History   Tobacco Use  . Smoking status: Never Smoker  . Smokeless tobacco: Never Used  Substance Use Topics  . Alcohol use: Yes    Comment: rare  . Drug use: No     Allergies   Fish allergy and Metronidazole   Review  of Systems Review of Systems + sore throat No cough No pleuritic pain No wheezing + sneezing + nasal congestion + post-nasal drainage + sinus pain/pressure No itchy/red eyes No earache No hemoptysis No SOB No fever, + chills No nausea No vomiting No abdominal pain No diarrhea No urinary symptoms No skin rash + fatigue No myalgias No headache Used OTC meds without relief   Physical Exam Triage Vital Signs ED Triage Vitals  Enc Vitals Group     BP 10/26/17 1618 (!) 140/100     Pulse Rate 10/26/17 1618 (!) 105     Resp --      Temp 10/26/17 1618 98 F (36.7 C)     Temp Source  10/26/17 1618 Oral     SpO2 10/26/17 1618 98 %     Weight 10/26/17 1619 178 lb (80.7 kg)     Height 10/26/17 1619 5' (1.524 m)     Head Circumference --      Peak Flow --      Pain Score 10/26/17 1619 3     Pain Loc --      Pain Edu? --      Excl. in [place]? --    No data found.  Updated Vital Signs BP (!) 140/100 (BP Location: Right Arm)   Pulse (!) 105   Temp 98 F (36.7 C) (Oral)   Ht 5' (1.524 m)   Wt 178 lb (80.7 kg)   LMP 10/22/2017 (Exact Date)   SpO2 98%   BMI 34.76 kg/m   Visual Acuity Right Eye Distance:   Left Eye Distance:   Bilateral Distance:    Right Eye Near:   Left Eye Near:    Bilateral Near:     Physical Exam Nursing notes and Vital Signs reviewed. Appearance:  Patient appears stated age, and in no acute distress Eyes:  Pupils are equal, round, and reactive to light and accomodation.  Extraocular movement is intact.  Conjunctivae are not inflamed  Ears:  Canals normal.  Tympanic membranes normal.  Nose:  Mildly congested turbinates.  No sinus tenderness.  Pharynx:  Normal Neck:  Supple.  Enlarged posterior/lateral nodes are palpated bilaterally, tender to palpation on the left.   Lungs:  Clear to auscultation.  Breath sounds are equal.  Moving air well. Heart:  Regular rate and rhythm without murmurs, rubs, or gallops.  Abdomen:  Nontender without masses or hepatosplenomegaly.  Bowel sounds are present.  No CVA or flank tenderness.  Extremities:  No edema.  Skin:  No rash present.    UC Treatments / Results  Labs (all labs ordered are listed, but only abnormal results are displayed) Labs Reviewed - No data to display  EKG  EKG Interpretation None       Radiology No results found.  Procedures Procedures (including critical care time)  Medications Ordered in UC Medications - No data to display   Initial Impression / Assessment and Plan / UC Course  I have reviewed the triage vital signs and the nursing notes.  Pertinent labs &  imaging results that were available during my care of the patient were reviewed by me and considered in my medical decision making (see chart for details).    There is no evidence of bacterial infection today.   Treat symptomatically for now.  Begin prednisone burst/taper. Take plain guaifenesin (1200mg  extended release tabs such as Mucinex) twice daily, with plenty of water, for cough and congestion.  Continue pseudoephedrine for sinus congestion.  Get  adequate rest.   May use Afrin nasal spray (or generic oxymetazoline) each morning for about 5 days and then discontinue.  Also recommend using saline nasal spray several times daily and saline nasal irrigation (AYR is a common brand).  Use Flonase nasal spray each morning after using Afrin nasal spray and saline nasal irrigation. Try warm salt water gargles for sore throat.  Stop all antihistamines for now, and other non-prescription cough/cold preparations. May take Delsym Cough Suppressant at bedtime for nighttime cough.  Begin Amoxicillin if not improving about one week or if persistent fever develops (Given a prescription to hold, with an expiration date)  Followup with Family Doctor if not improved in about 10 days.   Final Clinical Impressions(s) / UC Diagnoses   Final diagnoses:  Viral URI    ED Discharge Orders        Ordered    predniSONE (DELTASONE) 20 MG tablet     10/26/17 1705    amoxicillin (AMOXIL) 875 MG tablet  2 times daily     10/26/17 1706         Kandra Nicolas, MD 10/31/17 1340

## 2017-12-14 IMAGING — CR DG CHEST 2V
2 series · 2 of 2 positions shown · non-contrast
Comparison: 09/14/2012

CLINICAL DATA: Chest pressure starting today.  Chest pain.

EXAM:
CHEST  2 VIEW

[w chest pa]
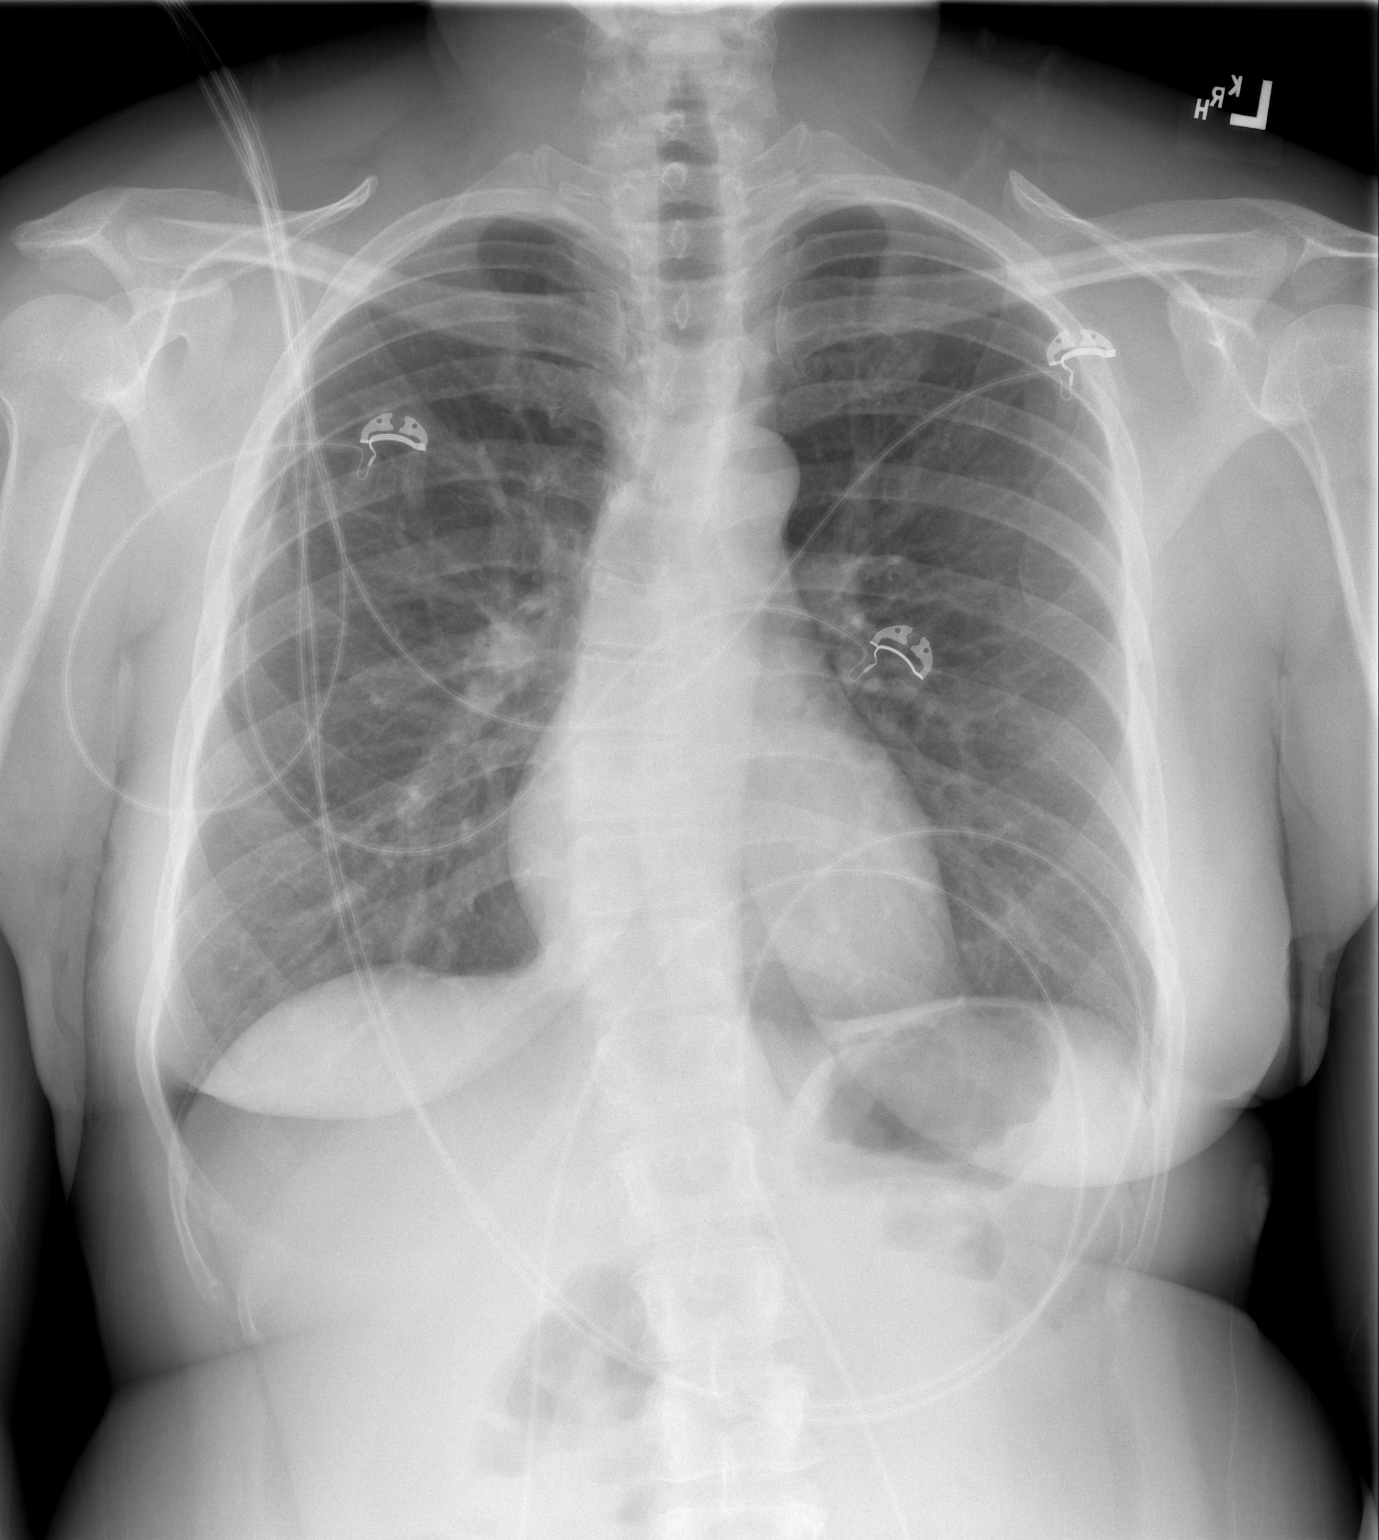

[w chest lat]
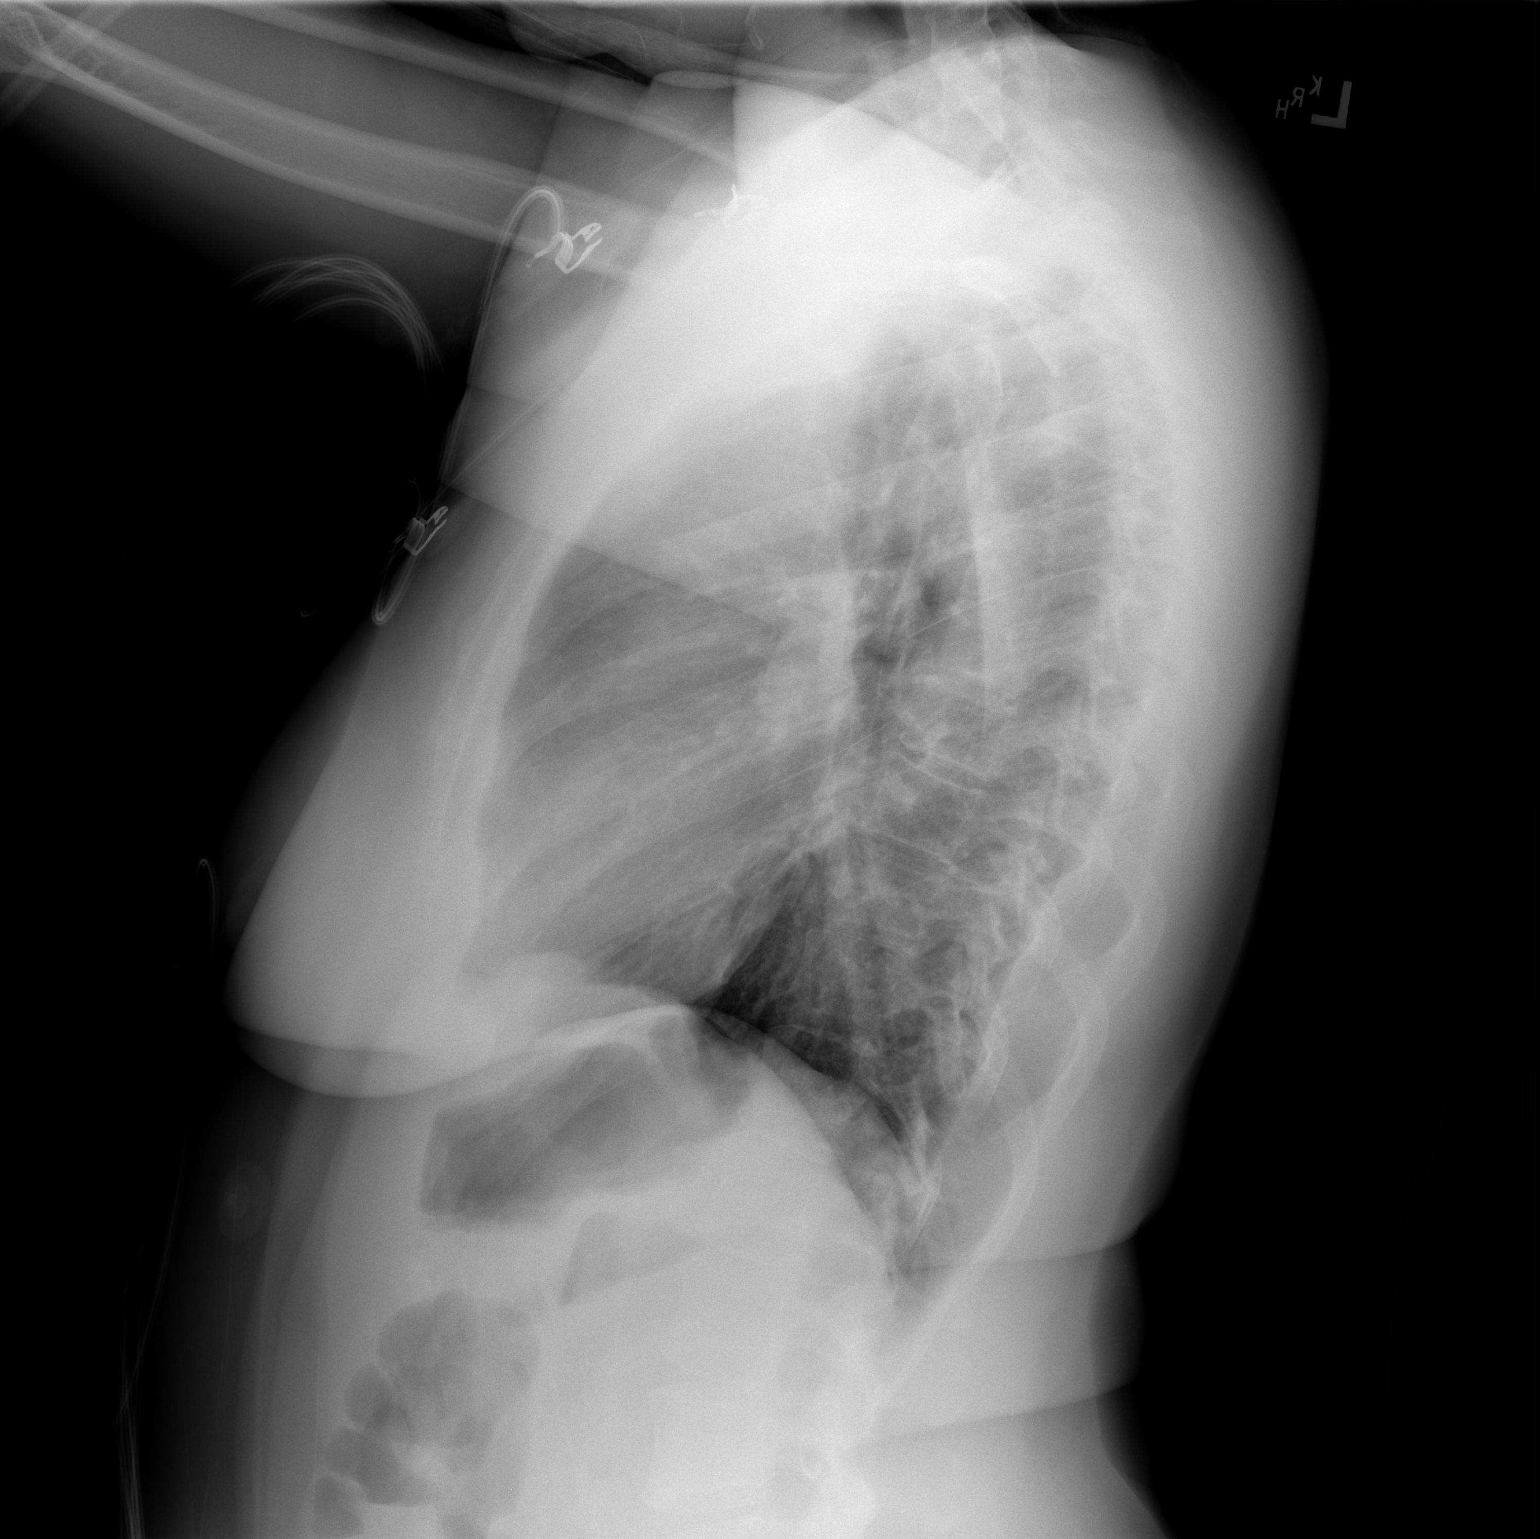

[2 of 2 positions shown; findings below may reference images not displayed]

FINDINGS: 17 degrees of dextroconvex thoracic scoliosis between T6 is and T11.
Cysts

The lungs appear clear. Cardiac and mediastinal contours normal. No
pleural effusion identified.
IMPRESSION: 1. No acute thoracic findings.
2. 17 degrees dextroconvex thoracic scoliosis.

## 2017-12-22 DIAGNOSIS — E669 Obesity, unspecified: Secondary | ICD-10-CM | POA: Diagnosis not present

## 2017-12-22 DIAGNOSIS — R7989 Other specified abnormal findings of blood chemistry: Secondary | ICD-10-CM | POA: Diagnosis not present

## 2017-12-22 DIAGNOSIS — I10 Essential (primary) hypertension: Secondary | ICD-10-CM | POA: Diagnosis not present

## 2017-12-25 DIAGNOSIS — H5203 Hypermetropia, bilateral: Secondary | ICD-10-CM | POA: Diagnosis not present

## 2017-12-25 DIAGNOSIS — H5213 Myopia, bilateral: Secondary | ICD-10-CM | POA: Diagnosis not present

## 2017-12-31 DIAGNOSIS — G43009 Migraine without aura, not intractable, without status migrainosus: Secondary | ICD-10-CM | POA: Diagnosis not present

## 2018-02-06 DIAGNOSIS — N9089 Other specified noninflammatory disorders of vulva and perineum: Secondary | ICD-10-CM | POA: Diagnosis not present

## 2018-02-06 DIAGNOSIS — M9089 Osteopathy in diseases classified elsewhere, multiple sites: Secondary | ICD-10-CM | POA: Diagnosis not present

## 2018-03-20 DIAGNOSIS — I10 Essential (primary) hypertension: Secondary | ICD-10-CM | POA: Diagnosis not present

## 2018-03-20 DIAGNOSIS — Z0000 Encounter for general adult medical examination without abnormal findings: Secondary | ICD-10-CM | POA: Diagnosis not present

## 2018-03-20 DIAGNOSIS — E559 Vitamin D deficiency, unspecified: Secondary | ICD-10-CM | POA: Diagnosis not present

## 2018-05-06 DIAGNOSIS — G43009 Migraine without aura, not intractable, without status migrainosus: Secondary | ICD-10-CM | POA: Diagnosis not present

## 2018-05-06 DIAGNOSIS — G479 Sleep disorder, unspecified: Secondary | ICD-10-CM | POA: Diagnosis not present

## 2018-07-18 ENCOUNTER — Emergency Department (HOSPITAL_BASED_OUTPATIENT_CLINIC_OR_DEPARTMENT_OTHER): Payer: 59

## 2018-07-18 ENCOUNTER — Other Ambulatory Visit: Payer: Self-pay

## 2018-07-18 ENCOUNTER — Emergency Department (HOSPITAL_BASED_OUTPATIENT_CLINIC_OR_DEPARTMENT_OTHER)
Admission: EM | Admit: 2018-07-18 | Discharge: 2018-07-18 | Disposition: A | Discharge status: Home / Self Care | Payer: 59 | Attending: Emergency Medicine | Admitting: Emergency Medicine

## 2018-07-18 ENCOUNTER — Encounter (HOSPITAL_BASED_OUTPATIENT_CLINIC_OR_DEPARTMENT_OTHER): Payer: Self-pay | Admitting: Emergency Medicine

## 2018-07-18 DIAGNOSIS — M6289 Other specified disorders of muscle: Secondary | ICD-10-CM

## 2018-07-18 DIAGNOSIS — R29898 Other symptoms and signs involving the musculoskeletal system: Secondary | ICD-10-CM

## 2018-07-18 DIAGNOSIS — M79661 Pain in right lower leg: Secondary | ICD-10-CM | POA: Diagnosis not present

## 2018-07-18 DIAGNOSIS — Z79899 Other long term (current) drug therapy: Secondary | ICD-10-CM | POA: Insufficient documentation

## 2018-07-18 DIAGNOSIS — I10 Essential (primary) hypertension: Secondary | ICD-10-CM | POA: Insufficient documentation

## 2018-07-18 DIAGNOSIS — R2241 Localized swelling, mass and lump, right lower limb: Secondary | ICD-10-CM | POA: Diagnosis not present

## 2018-07-18 NOTE — ED Triage Notes (Signed)
Pt c/o right calf pain for past 6-8 months, increased past couple days. Pt denies injury. Pt has not been seen by any other medical professional for this.

## 2018-07-18 NOTE — ED Provider Notes (Signed)
[facility] Provider Note   CSN: [id number] Arrival date & time: 07/18/18  1028     History   Chief Complaint Chief Complaint  Patient presents with  . Leg Pain    HPI Leah Reid is a 42 y.o. female.  HPI Patient presents to the emergency room with complaints of right calf pain.  Patient states she is had some intermittent episodes of pain in her right calf for the last 6 months or so.  The symptoms were mild and the patient did not think too much of it.  However in the last few days the patient has noticed more persistent pain in her right calf.  She is not having any pain in her knee and no pain anteriorly.  This pain does not specifically come with activity.  She has not noticed any swelling.  No chest pain or shortness of breath.  Patient called in urgent care and they suggested she come to the emergency room because of the possibility of a blood clot. Past Medical History:  Diagnosis Date  . Arthritis   . Hypertension   . Migraines   . Migraines     Patient Active Problem List   Diagnosis Date Noted  . Dental abscess 12/06/2016  . Nausea 11/28/2016  . Vertigo 11/28/2016  . Drug reaction 11/28/2016  . ACUTE MAXILLARY SINUSITIS 06/11/2011  . ACUTE PHARYNGITIS 06/11/2011  . DERMATOPHYTOSIS OF THE BODY 10/28/2008  . CELLULITIS, GROIN, LEFT 10/28/2008  . [place] SYNDROME 10/17/2008  . HYPERBILIRUBINEMIA 10/17/2008  . BENIGN NEOPLASM OF LIVER AND BILIARY PASSAGES 10/07/2008  . HYPERCHOLESTEROLEMIA 10/07/2008  . COMMON MIGRAINE 10/07/2008  . GERD 10/07/2008  . CONSTIPATION, CHRONIC 10/07/2008  . OSTEOARTHRITIS, GENERALIZED, MULTIPLE JOINTS 10/07/2008  . FIBROMYALGIA 10/07/2008  . ABDOMINAL PAIN, CHRONIC 10/07/2008  . HYPERTENSION 09/05/2008  . FOLLICULITIS 51/10/5850    Past Surgical History:  Procedure Laterality Date  . DENTAL SURGERY    . TONSILLECTOMY    . WISDOM TOOTH EXTRACTION       OB History   None       Home Medications    Prior to Admission medications   Medication Sig Start Date End Date Taking? Authorizing Provider  amoxicillin (AMOXIL) 875 MG tablet Take 1 tablet (875 mg total) by mouth 2 (two) times daily. (Rx void after 11/03/17) 10/26/17   Kandra Nicolas, MD  b complex vitamins tablet Take 1 tablet by mouth daily.    [provider]  hydrochlorothiazide (HYDRODIURIL) 25 MG tablet Take 25 mg by mouth daily.    [provider]  Multiple Vitamin (MULTIVITAMIN) tablet Take 1 tablet by mouth daily.    [provider]  norethindrone-ethinyl estradiol 1/35 (ORTHO-NOVUM, NORTREL,CYCLAFEM) tablet Take 1 tablet by mouth daily.    [provider]  OVER THE COUNTER MEDICATION keratin    [provider]  predniSONE (DELTASONE) 20 MG tablet Take one tab by mouth twice daily for 4 days, then one daily. Take with food. 10/26/17   Kandra Nicolas, MD  zinc gluconate 50 MG tablet Take 50 mg by mouth daily.    [provider]    Family History Family History  Problem Relation Age of Onset  . Lung cancer Father   . Hypertension Maternal Grandmother   . Hyperlipidemia Maternal Grandmother   . Stroke Maternal Grandmother   . Diabetes Maternal Grandmother     Social History Social History   Tobacco Use  . Smoking status: Never Smoker  .  Smokeless tobacco: Never Used  Substance Use Topics  . Alcohol use: Yes    Comment: rare  . Drug use: No     Allergies   Fish allergy and Metronidazole   Review of Systems Review of Systems  All other systems reviewed and are negative.    Physical Exam Updated Vital Signs BP 128/89 (BP Location: Right Arm)   Pulse 84   Temp 98.2 F (36.8 C) (Oral)   Resp 16   Ht 1.524 m (5')   Wt 74.4 kg   LMP 07/08/2018   SpO2 100%   BMI 32.03 kg/m   Physical Exam  Constitutional: She appears well-developed and well-nourished. No distress.  HENT:  Head: Normocephalic and atraumatic.  Right  Ear: External ear normal.  Left Ear: External ear normal.  Eyes: Conjunctivae are normal. Right eye exhibits no discharge. Left eye exhibits no discharge. No scleral icterus.  Neck: Neck supple. No tracheal deviation present.  Cardiovascular: Normal rate.  Pulmonary/Chest: Effort normal. No stridor. No respiratory distress.  Abdominal: She exhibits no distension.  Musculoskeletal: She exhibits no edema.  Tenderness palpation right calf, normal dorsalis pedis pulse, normal sensation, no tenderness palpation popliteal fossa  Neurological: She is alert. Cranial nerve deficit: no gross deficits.  Skin: Skin is warm and dry. No rash noted.  Psychiatric: She has a normal mood and affect.  Nursing note and vitals reviewed.    ED Treatments / Results  Labs (all labs ordered are listed, but only abnormal results are displayed) Labs Reviewed - No data to display  EKG None  Radiology US Venous Img Lower Unilateral Right  Result Date: 07/18/2018 CLINICAL DATA:  42 year old female with right calf pain intermittently for the past 6-8 months but acutely worse today. EXAM: RIGHT LOWER EXTREMITY VENOUS DOPPLER ULTRASOUND TECHNIQUE: Gray-scale sonography with graded compression, as well as color Doppler and duplex ultrasound were performed to evaluate the lower extremity deep venous systems from the level of the common femoral vein and including the common femoral, femoral, profunda femoral, popliteal and calf veins including the posterior tibial, peroneal and gastrocnemius veins when visible. The superficial great saphenous vein was also interrogated. Spectral Doppler was utilized to evaluate flow at rest and with distal augmentation maneuvers in the common femoral, femoral and popliteal veins. COMPARISON:  None. FINDINGS: Contralateral Common Femoral Vein: Respiratory phasicity is normal and symmetric with the symptomatic side. No evidence of thrombus. Normal compressibility. Common Femoral Vein: No  evidence of thrombus. Normal compressibility, respiratory phasicity and response to augmentation. Saphenofemoral Junction: No evidence of thrombus. Normal compressibility and flow on color Doppler imaging. Profunda Femoral Vein: No evidence of thrombus. Normal compressibility and flow on color Doppler imaging. Femoral Vein: No evidence of thrombus. Normal compressibility, respiratory phasicity and response to augmentation. Popliteal Vein: No evidence of thrombus. Normal compressibility, respiratory phasicity and response to augmentation. Calf Veins: No evidence of thrombus. Normal compressibility and flow on color Doppler imaging. Superficial Great Saphenous Vein: No evidence of thrombus. Normal compressibility. Venous Reflux:  None. Other Findings: In the region of clinical concern, in the superior aspect of the gastrocnemius muscle there is a lobular solid soft tissue mass measuring approximately 3.0 x 1.4 x 1.9 cm. The echogenicity and echotexture of the nodule is similar to the adjacent muscular tissue. There is evidence of internal vascularity on color Doppler imaging. IMPRESSION: 1. Negative for acute DVT. 2. However, there is a lobular 3.0 x 1.4 x 1.9 cm soft tissue mass with internal vascularity within the superior  aspect of the right gastrocnemius musculature. This is concerning for a potential neoplasm including soft tissue sarcoma. Recommend further evaluation with MRI of the calf with and without gadolinium contrast. Electronically Signed   By: Jacqulynn Cadet M.D.   On: 07/18/2018 12:17    Procedures Procedures (including critical care time)  Medications Ordered in ED Medications - No data to display   Initial Impression / Assessment and Plan / ED Course  I have reviewed the triage vital signs and the nursing notes.  Pertinent labs & imaging results that were available during my care of the patient were reviewed by me and considered in my medical decision making (see chart for  details).   She presented to the emergency room for evaluation of right calf pain that started several months ago.  Patient has no other systemic symptoms.  Her ultrasound showed a lobular mass in the right gastrocnemius muscle.  I explained to the patient that this will need to be evaluated further to make sure it is not a malignant type of lesion.  Patient has a primary care doctor and she will call early this week to arrange outpatient MRI evaluation.  Final Clinical Impressions(s) / ED Diagnoses   Final diagnoses:  Muscle mass of leg    ED Discharge Orders    None       Dorie Rank, MD 07/18/18 1307

## 2018-07-18 NOTE — Discharge Instructions (Addendum)
Follow up with your primary care doctor this week to arrange for further evaluation of the mass noted on the Ultrasound today.  MRI was recommended by our radiologist to evaluate this further.

## 2018-07-21 DIAGNOSIS — I10 Essential (primary) hypertension: Secondary | ICD-10-CM | POA: Diagnosis not present

## 2018-07-21 DIAGNOSIS — R2241 Localized swelling, mass and lump, right lower limb: Secondary | ICD-10-CM | POA: Diagnosis not present

## 2018-07-22 ENCOUNTER — Other Ambulatory Visit: Payer: Self-pay | Admitting: Family Medicine

## 2018-07-22 DIAGNOSIS — R2241 Localized swelling, mass and lump, right lower limb: Secondary | ICD-10-CM

## 2018-07-29 DIAGNOSIS — G479 Sleep disorder, unspecified: Secondary | ICD-10-CM | POA: Diagnosis not present

## 2018-07-29 DIAGNOSIS — G43009 Migraine without aura, not intractable, without status migrainosus: Secondary | ICD-10-CM | POA: Diagnosis not present

## 2018-07-31 ENCOUNTER — Ambulatory Visit
Admission: RE | Admit: 2018-07-31 | Discharge: 2018-07-31 | Disposition: A | Discharge status: Home / Self Care | Payer: 59 | Source: Ambulatory Visit | Attending: Family Medicine | Admitting: Family Medicine

## 2018-07-31 DIAGNOSIS — R2241 Localized swelling, mass and lump, right lower limb: Secondary | ICD-10-CM

## 2018-07-31 DIAGNOSIS — K625 Hemorrhage of anus and rectum: Secondary | ICD-10-CM | POA: Diagnosis not present

## 2018-07-31 DIAGNOSIS — K5904 Chronic idiopathic constipation: Secondary | ICD-10-CM | POA: Diagnosis not present

## 2018-07-31 MED ORDER — GADOBENATE DIMEGLUMINE 529 MG/ML IV SOLN
15.0000 mL | Freq: Once | INTRAVENOUS | Status: AC | PRN
  Administered 2018-07-31: 15 mL via INTRAVENOUS

## 2018-08-04 DIAGNOSIS — I999 Unspecified disorder of circulatory system: Secondary | ICD-10-CM | POA: Diagnosis not present

## 2018-08-04 DIAGNOSIS — I10 Essential (primary) hypertension: Secondary | ICD-10-CM | POA: Diagnosis not present

## 2018-08-04 DIAGNOSIS — F4321 Adjustment disorder with depressed mood: Secondary | ICD-10-CM | POA: Diagnosis not present

## 2018-08-23 ENCOUNTER — Other Ambulatory Visit: Payer: Self-pay

## 2018-08-23 ENCOUNTER — Emergency Department (HOSPITAL_BASED_OUTPATIENT_CLINIC_OR_DEPARTMENT_OTHER): Payer: 59

## 2018-08-23 ENCOUNTER — Encounter (HOSPITAL_BASED_OUTPATIENT_CLINIC_OR_DEPARTMENT_OTHER): Payer: Self-pay | Admitting: Adult Health

## 2018-08-23 ENCOUNTER — Emergency Department (HOSPITAL_BASED_OUTPATIENT_CLINIC_OR_DEPARTMENT_OTHER)
Admission: EM | Admit: 2018-08-23 | Discharge: 2018-08-24 | Disposition: A | Discharge status: Home / Self Care | Payer: 59 | Attending: Emergency Medicine | Admitting: Emergency Medicine

## 2018-08-23 DIAGNOSIS — R51 Headache: Secondary | ICD-10-CM | POA: Insufficient documentation

## 2018-08-23 DIAGNOSIS — Z79899 Other long term (current) drug therapy: Secondary | ICD-10-CM | POA: Insufficient documentation

## 2018-08-23 DIAGNOSIS — I10 Essential (primary) hypertension: Secondary | ICD-10-CM | POA: Insufficient documentation

## 2018-08-23 DIAGNOSIS — R0789 Other chest pain: Secondary | ICD-10-CM | POA: Diagnosis not present

## 2018-08-23 DIAGNOSIS — R079 Chest pain, unspecified: Secondary | ICD-10-CM | POA: Diagnosis not present

## 2018-08-23 DIAGNOSIS — G4485 Primary stabbing headache: Secondary | ICD-10-CM

## 2018-08-23 LAB — CBC WITH DIFFERENTIAL/PLATELET
Abs Immature Granulocytes: 0.01 10*3/uL (ref 0.00–0.07)
Basophils Absolute: 0.1 10*3/uL (ref 0.0–0.1)
Basophils Relative: 1 %
Eosinophils Absolute: 0.2 10*3/uL (ref 0.0–0.5)
Eosinophils Relative: 3 %
HCT: 41.5 % (ref 36.0–46.0)
Hemoglobin: 13 g/dL (ref 12.0–15.0)
Immature Granulocytes: 0 %
Lymphocytes Relative: 30 %
Lymphs Abs: 2.4 10*3/uL (ref 0.7–4.0)
MCH: 30.5 pg (ref 26.0–34.0)
MCHC: 31.3 g/dL (ref 30.0–36.0)
MCV: 97.4 fL (ref 80.0–100.0)
Monocytes Absolute: 0.6 10*3/uL (ref 0.1–1.0)
Monocytes Relative: 8 %
Neutro Abs: 4.7 10*3/uL (ref 1.7–7.7)
Neutrophils Relative %: 58 %
Platelets: 305 10*3/uL (ref 150–400)
RBC: 4.26 MIL/uL (ref 3.87–5.11)
RDW: 12.8 % (ref 11.5–15.5)
WBC: 7.9 10*3/uL (ref 4.0–10.5)
nRBC: 0 % (ref 0.0–0.2)

## 2018-08-23 LAB — COMPREHENSIVE METABOLIC PANEL
ALT: 15 U/L (ref 0–44)
AST: 15 U/L (ref 15–41)
Albumin: 3.8 g/dL (ref 3.5–5.0)
Alkaline Phosphatase: 71 U/L (ref 38–126)
Anion gap: 6 (ref 5–15)
BUN: 12 mg/dL (ref 6–20)
CO2: 23 mmol/L (ref 22–32)
Calcium: 8.9 mg/dL (ref 8.9–10.3)
Chloride: 107 mmol/L (ref 98–111)
Creatinine, Ser: 1.04 mg/dL — ABNORMAL HIGH (ref 0.44–1.00)
GFR calc Af Amer: >60 mL/min (ref >60)
GFR calc non Af Amer: >60 mL/min (ref >60)
Glucose, Bld: 90 mg/dL (ref 70–99)
Potassium: 3.8 mmol/L (ref 3.5–5.1)
Sodium: 136 mmol/L (ref 135–145)
Total Bilirubin: 0.5 mg/dL (ref 0.3–1.2)
Total Protein: 7.3 g/dL (ref 6.5–8.1)

## 2018-08-23 LAB — TROPONIN I: Troponin I: <0.03 ng/mL (ref <0.03)

## 2018-08-23 NOTE — ED Provider Notes (Signed)
[facility] EMERGENCY DEPARTMENT Provider Note   CSN: [id number] Arrival date & time: 08/23/18  1927     History   Chief Complaint Chief Complaint  Patient presents with  . Headache    HPI Leah Reid is a 42 y.o. female.  HPI   Hx of migraines but has had dull headache for the last 2 days but today began to have sharp pains in right temple started today, is intermittent, reports she can be talking and it will grab her attention but it is like a stabbing pain, happens for 5-10 seconds then will improve then 20 minutes later.  Not exertional. Nothing seems to make it better or worse.  Not worse with bright lights or loud sounds. Nothing alleviates it. Has hx of migraines but has never head headache like this before.  Has not taken anything for it.  No fevers, no trauma.  No numbness/weakness/trouble walking or talking, no facial droop.   On ROS, reports she has had brief episodes of chest pain, sharp pain, comes on for second and goes. Not exertional, happens spontaneously. Last had it at 6AM this morning. No shortness of breath, no nausea.   Past Medical History:  Diagnosis Date  . Arthritis   . Hypertension   . Migraines   . Migraines     Patient Active Problem List   Diagnosis Date Noted  . Dental abscess 12/06/2016  . Nausea 11/28/2016  . Vertigo 11/28/2016  . Drug reaction 11/28/2016  . ACUTE MAXILLARY SINUSITIS 06/11/2011  . ACUTE PHARYNGITIS 06/11/2011  . DERMATOPHYTOSIS OF THE BODY 10/28/2008  . CELLULITIS, GROIN, LEFT 10/28/2008  . [place] SYNDROME 10/17/2008  . HYPERBILIRUBINEMIA 10/17/2008  . BENIGN NEOPLASM OF LIVER AND BILIARY PASSAGES 10/07/2008  . HYPERCHOLESTEROLEMIA 10/07/2008  . COMMON MIGRAINE 10/07/2008  . GERD 10/07/2008  . CONSTIPATION, CHRONIC 10/07/2008  . OSTEOARTHRITIS, GENERALIZED, MULTIPLE JOINTS 10/07/2008  . FIBROMYALGIA 10/07/2008  . ABDOMINAL PAIN, CHRONIC 10/07/2008  . HYPERTENSION 09/05/2008  . FOLLICULITIS  40/98/1191    Past Surgical History:  Procedure Laterality Date  . DENTAL SURGERY    . TONSILLECTOMY    . WISDOM TOOTH EXTRACTION       OB History   No obstetric history on file.      Home Medications    Prior to Admission medications   Medication Sig Start Date End Date Taking? Authorizing Provider  amoxicillin (AMOXIL) 875 MG tablet Take 1 tablet (875 mg total) by mouth 2 (two) times daily. (Rx void after 11/03/17) 10/26/17   Kandra Nicolas, MD  b complex vitamins tablet Take 1 tablet by mouth daily.    [provider]  hydrochlorothiazide (HYDRODIURIL) 25 MG tablet Take 25 mg by mouth daily.    [provider]  Multiple Vitamin (MULTIVITAMIN) tablet Take 1 tablet by mouth daily.    [provider]  norethindrone-ethinyl estradiol 1/35 (ORTHO-NOVUM, NORTREL,CYCLAFEM) tablet Take 1 tablet by mouth daily.    [provider]  OVER THE COUNTER MEDICATION keratin    [provider]  predniSONE (DELTASONE) 20 MG tablet Take one tab by mouth twice daily for 4 days, then one daily. Take with food. 10/26/17   Kandra Nicolas, MD  zinc gluconate 50 MG tablet Take 50 mg by mouth daily.    [provider]    Family History Family History  Problem Relation Age of Onset  . Lung cancer Father   . Hypertension Maternal Grandmother   . Hyperlipidemia Maternal Grandmother   .  Stroke Maternal Grandmother   . Diabetes Maternal Grandmother     Social History Social History   Tobacco Use  . Smoking status: Never Smoker  . Smokeless tobacco: Never Used  Substance Use Topics  . Alcohol use: Yes    Comment: rare  . Drug use: No     Allergies   Fish allergy and Metronidazole   Review of Systems Review of Systems  Constitutional: Negative for fever.  HENT: Negative for congestion and sore throat.   Eyes: Negative for visual disturbance.  Respiratory: Negative for cough and shortness of breath.   Cardiovascular: Negative for  chest pain.  Gastrointestinal: Negative for abdominal pain and vomiting. Nausea: slight yesterday and resolved.  Genitourinary: Negative for difficulty urinating.  Musculoskeletal: Negative for neck pain.  Skin: Negative for rash.  Neurological: Positive for headaches. Negative for syncope, facial asymmetry, speech difficulty, weakness and numbness.     Physical Exam Updated Vital Signs BP (!) 134/105 (BP Location: Left Arm)   Pulse 88   Temp 98.2 F (36.8 C) (Oral)   Resp 18   Ht 4\' 11"  (1.499 m)   Wt 76.2 kg   LMP 07/28/2018 (Approximate)   SpO2 100%   BMI 33.93 kg/m   Physical Exam Vitals signs and nursing note reviewed.  Constitutional:      General: She is not in acute distress.    Appearance: She is well-developed. She is not diaphoretic.  HENT:     Head: Normocephalic and atraumatic.  Eyes:     Conjunctiva/sclera: Conjunctivae normal.  Neck:     Musculoskeletal: Normal range of motion.  Cardiovascular:     Rate and Rhythm: Normal rate and regular rhythm.     Heart sounds: Normal heart sounds. No murmur. No friction rub. No gallop.   Pulmonary:     Effort: Pulmonary effort is normal. No respiratory distress.     Breath sounds: Normal breath sounds. No wheezing or rales.  Abdominal:     General: There is no distension.     Palpations: Abdomen is soft.     Tenderness: There is no abdominal tenderness. There is no guarding.  Musculoskeletal:        General: No tenderness.  Skin:    General: Skin is warm and dry.     Findings: No erythema or rash.  Neurological:     Mental Status: She is alert and oriented to person, place, and time.     GCS: GCS eye subscore is 4. GCS verbal subscore is 5. GCS motor subscore is 6.     Cranial Nerves: Cranial nerves are intact.     Sensory: Sensation is intact.     Motor: Motor function is intact.     Coordination: Coordination is intact. Finger-Nose-Finger Test normal.      ED Treatments / Results  Labs (all labs  ordered are listed, but only abnormal results are displayed) Labs Reviewed  COMPREHENSIVE METABOLIC PANEL - Abnormal; Notable for the following components:      Result Value   Creatinine, Ser 1.04 (*)    All other components within normal limits  CBC WITH DIFFERENTIAL/PLATELET  TROPONIN I    EKG EKG Interpretation  Date/Time:  Sunday August 23 2018 20:59:08 EST Ventricular Rate:  64 PR Interval:    QRS Duration: 86 QT Interval:  392 QTC Calculation: 405 R Axis:   20 Text Interpretation:  Sinus rhythm Low voltage, precordial leads Baseline wander in lead(s) V2 No significant change since last tracing Confirmed  by Gareth Morgan [phone]) on 08/23/2018 9:57:27 PM   Radiology Dg Chest 2 View  Result Date: 08/23/2018 CLINICAL DATA:  42 year old female with increasing chest pain for 2 months. Acute chest pain this morning, headache and dizziness. EXAM: CHEST - 2 VIEW COMPARISON:  11/09/2015 chest radiographs and earlier. FINDINGS: Stable mild to moderate dextroconvex thoracic scoliosis. Cardiac size and mediastinal contours are within normal limits. Visualized tracheal air column is within normal limits. Both lungs are clear. No pneumothorax or pleural effusion. No acute osseous abnormality identified. Negative visible bowel gas pattern. IMPRESSION: Negative aside from scoliosis.  No cardiopulmonary abnormality. Electronically Signed   By: Genevie Ann M.D.   On: 08/23/2018 20:58   Ct Head Wo Contrast  Result Date: 08/23/2018 CLINICAL DATA:  Headache, new, malignancy suspected. Patient reports headache for 2 days. EXAM: CT HEAD WITHOUT CONTRAST TECHNIQUE: Contiguous axial images were obtained from the base of the skull through the vertex without intravenous contrast. COMPARISON:  Brain MRI 08/25/2012 FINDINGS: Brain: No intracranial hemorrhage, mass effect, or midline shift. No hydrocephalus. The basilar cisterns are patent. No evidence of territorial infarct or acute ischemia. No extra-axial  or intracranial fluid collection. Vascular: No hyperdense vessel or unexpected calcification. Skull: Normal. Negative for fracture or focal lesion. Sinuses/Orbits: Paranasal sinuses and mastoid air cells are clear. The visualized orbits are unremarkable. Other: None. IMPRESSION: Negative head CT. Electronically Signed   By: Keith Rake M.D.   On: 08/23/2018 20:59    Procedures Procedures (including critical care time)  Medications Ordered in ED Medications - No data to display   Initial Impression / Assessment and Plan / ED Course  I have reviewed the triage vital signs and the nursing notes.  Pertinent labs & imaging results that were available during my care of the patient were reviewed by me and considered in my medical decision making (see chart for details).     42yo yo female presents with concern for headache.  Patient with history of migraines but this headache is different, given stabbing nature, head CT done and negative. Low suspicion for [facility]. Also reports chest pain on ROS. Atypical by history.  Low suspicion for PE by history.  Had brief episode over 12 hours ago, troponin and ECG WNL. No sign of ACS. Recommend close follow up with PCP. Patient discharged in stable condition with understanding of reasons to return.   Final Clinical Impressions(s) / ED Diagnoses   Final diagnoses:  Stabbing headache  Chest pain, unspecified type    ED Discharge Orders    None       Gareth Morgan, MD 08/24/18 0104

## 2018-08-23 NOTE — ED Notes (Signed)
Patient left at this time with all belongings. 

## 2018-08-23 NOTE — ED Triage Notes (Signed)
Presents with dull headache for the past 2 days, today this afternoon she began having intermittent sharp shooting pain from her right temple in her the back of her head. He denies ever having a headache like this before. She does have a history of migraines around her menstrual cycle. Her BP is not well controlled a this time and they have started her on Losartan 2 weeks ago. BP here is 156/114 at this time. She endorses pain at this time. The pain has not subsided, however the sharp pains do come and go. Neurologically intact. Denies numbness, tingling, blurred vision.

## 2018-09-11 ENCOUNTER — Encounter: Payer: Self-pay | Admitting: Vascular Surgery

## 2018-09-11 ENCOUNTER — Ambulatory Visit (INDEPENDENT_AMBULATORY_CARE_PROVIDER_SITE_OTHER): Payer: 59 | Admitting: Vascular Surgery

## 2018-09-11 ENCOUNTER — Other Ambulatory Visit: Payer: Self-pay

## 2018-09-11 VITALS — BP 133/96 | HR 67 | Temp 97.2°F | Resp 16 | Ht 59.0 in | Wt 168.0 lb

## 2018-09-11 DIAGNOSIS — Q279 Congenital malformation of peripheral vascular system, unspecified: Secondary | ICD-10-CM

## 2018-09-11 NOTE — Progress Notes (Signed)
Patient ID: Ivor Reining, female   DOB: November 16, 1975, 43 y.o.   MRN: [id number]  Reason for Consult: New Patient (Initial Visit) (Ref by Alphonzo Severance, PA [phone].  Hemangioma v/s venous malformation. Calf pain yesterday 09/10/18)   Referred by Lenard Galloway, NP  Subjective:     HPI:  JERITA WIMBUSH is a 43 y.o. female presents for evaluation of right calf mass which is possible been called hemangioma versus venous malformation.  States that she first noticed this approximately 2 months ago and had persistent pain in the right calf which prompted her to visit the emergency department where she underwent a duplex of her right lower extremity to evaluate for DVT.  She has never had a history of DVT.  She does not know of any injuries to her right leg.  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  She is currently out of work until February for other reasons that she did not divulge today.  She is not on blood thinners.  She does not have any associated family history of this problem.  Past Medical History:  Diagnosis Date  . Arthritis   . Hypertension   . Migraines   . Migraines    Family History  Problem Relation Age of Onset  . Lung cancer Father   . Hypertension Maternal Grandmother   . Hyperlipidemia Maternal Grandmother   . Stroke Maternal Grandmother   . Diabetes Maternal Grandmother    Past Surgical History:  Procedure Laterality Date  . DENTAL SURGERY    . TONSILLECTOMY    . WISDOM TOOTH EXTRACTION      Short Social History:  Social History   Tobacco Use  . Smoking status: Never Smoker  . Smokeless tobacco: Never Used  Substance Use Topics  . Alcohol use: Yes    Comment: rare    Allergies  Allergen Reactions  . Fish Allergy   . Metronidazole     Current Outpatient Medications  Medication Sig Dispense Refill  . B Complex Vitamins (BL VITAMIN B COMPLEX PO) Take by mouth.    Marland Kitchen BIOTIN PO Take 1 tablet by mouth.  biotin-keratin (BIOTIN PLUS KERATIN) 10,000-100 mcg-mg Tab    . calcium citrate-vitamin D (CITRACAL+D) 315-200 MG-UNIT tablet Take by mouth.    . Flaxseed Oil (LINSEED OIL) OIL 1,400 mg by Misc.(Non-Drug; Combo Route) route.    Marland Kitchen losartan (COZAAR) 50 MG tablet Take by mouth.    . Milk Thistle (RA MILK THISTLE) 200 MG CAPS Take by mouth.    . Multiple Vitamin (MULTIVITAMIN) tablet Take 1 tablet by mouth daily.    . norethindrone-ethinyl estradiol (CYCLAFEM,ALYACEN) 0.5/0.75/1-35 MG-MCG tablet TAKE 1 TABLET BY MOUTH EVERY DAY    . OVER THE COUNTER MEDICATION keratin    . rizatriptan (MAXALT) 10 MG tablet Take at onset of HA. May repeat in 2 hours if needed but no more than 2 in 24 hours.    . Vitamin D, Ergocalciferol, (DRISDOL) 1.25 MG (50000 UT) CAPS capsule Take by mouth.    . zinc gluconate 50 MG tablet Take 50 mg by mouth daily.    Marland Kitchen zonisamide (ZONEGRAN) 100 MG capsule   6   No current facility-administered medications for this visit.     Review of Systems  Constitutional:  Constitutional negative. HENT: HENT negative.  Eyes: Eyes negative.  Cardiovascular: Positive for chest pain.  GI: Gastrointestinal negative.  Musculoskeletal: Positive for leg pain.  Skin: Skin negative.  Neurological:  Neurological negative. Hematologic: Hematologic/lymphatic negative.  Psychiatric: Psychiatric negative.        Objective:  Objective   Vitals:   09/11/18 1137  BP: (!) 133/96  Pulse: 67  Resp: 16  Temp: (!) 97.2 F (36.2 C)  TempSrc: Oral  SpO2: 98%  Weight: 168 lb (76.2 kg)  Height: 4\' 11"  (1.499 m)   Body mass index is 33.93 kg/m.  Physical Exam Constitutional:      Appearance: Normal appearance.  Eyes:     Pupils: Pupils are equal, round, and reactive to light.  Cardiovascular:     Rate and Rhythm: Normal rate.     Pulses:          Popliteal pulses are 2+ on the right side and 2+ on the left side.       Dorsalis pedis pulses are 2+ on the right side and 2+ on the left  side.  Pulmonary:     Effort: Pulmonary effort is normal.  Abdominal:     General: Abdomen is flat.  Musculoskeletal: Normal range of motion.        General: No swelling or deformity.  Skin:    General: Skin is warm and dry.     Capillary Refill: Capillary refill takes less than 2 seconds.     Comments: I cannot appreciate any masses in her right calf muscle  Neurological:     General: No focal deficit present.     Mental Status: She is alert.  Psychiatric:        Mood and Affect: Mood normal.        Behavior: Behavior normal.        Thought Content: Thought content normal.        Judgment: Judgment normal.     Data: I have reviewed her MRI with her including sagittal and axial images and discussed the reading of 2 cm x 2.7 cm lobulated mass no suggestive of vascular malformation/hemangioma     Assessment/Plan:     43 year old female presents for evaluation of what is possibly a vascular malformation versus hemangioma in the right calf.  Given that there is only been 1 evaluation other than the duplex a repeat MRI has been recommended for follow-up in 6 months.  As I cannot feel the mass on physical exam today and him really unsure of the pathology I would not myself want to remove this at this time.  She does seem frustrated with this plan.  I have offered her to have a second opinion possible with an orthopedic surgeon but have also recommended that she get the follow-up MRI at the 78-month interval for further evaluation.  Certainly if she has persistent issues with this she would benefit excision or if it is a vascular malformation she could have this injected possibly with 8 of interventional radiology.  She can see me on an as-needed basis.     Waynetta Sandy MD [facility]

## 2018-09-17 ENCOUNTER — Other Ambulatory Visit (HOSPITAL_BASED_OUTPATIENT_CLINIC_OR_DEPARTMENT_OTHER): Payer: Self-pay | Admitting: Gastroenterology

## 2018-09-17 DIAGNOSIS — Z1231 Encounter for screening mammogram for malignant neoplasm of breast: Secondary | ICD-10-CM | POA: Diagnosis not present

## 2018-09-17 DIAGNOSIS — Z1239 Encounter for other screening for malignant neoplasm of breast: Secondary | ICD-10-CM | POA: Diagnosis not present

## 2018-09-17 DIAGNOSIS — R933 Abnormal findings on diagnostic imaging of other parts of digestive tract: Secondary | ICD-10-CM

## 2018-09-22 DIAGNOSIS — K625 Hemorrhage of anus and rectum: Secondary | ICD-10-CM | POA: Diagnosis not present

## 2018-09-22 DIAGNOSIS — K648 Other hemorrhoids: Secondary | ICD-10-CM | POA: Diagnosis not present

## 2018-09-24 DIAGNOSIS — F4323 Adjustment disorder with mixed anxiety and depressed mood: Secondary | ICD-10-CM | POA: Diagnosis not present

## 2018-09-25 ENCOUNTER — Ambulatory Visit (HOSPITAL_BASED_OUTPATIENT_CLINIC_OR_DEPARTMENT_OTHER)
Admission: RE | Admit: 2018-09-25 | Discharge: 2018-09-25 | Disposition: A | Discharge status: Home / Self Care | Payer: 59 | Source: Ambulatory Visit | Attending: Gastroenterology | Admitting: Gastroenterology

## 2018-09-25 DIAGNOSIS — Z13228 Encounter for screening for other metabolic disorders: Secondary | ICD-10-CM | POA: Diagnosis not present

## 2018-09-25 DIAGNOSIS — E559 Vitamin D deficiency, unspecified: Secondary | ICD-10-CM | POA: Diagnosis not present

## 2018-09-25 DIAGNOSIS — K7689 Other specified diseases of liver: Secondary | ICD-10-CM | POA: Diagnosis not present

## 2018-09-25 DIAGNOSIS — R933 Abnormal findings on diagnostic imaging of other parts of digestive tract: Secondary | ICD-10-CM | POA: Insufficient documentation

## 2018-09-25 DIAGNOSIS — Z13220 Encounter for screening for lipoid disorders: Secondary | ICD-10-CM | POA: Diagnosis not present

## 2018-09-25 DIAGNOSIS — Z1329 Encounter for screening for other suspected endocrine disorder: Secondary | ICD-10-CM | POA: Diagnosis not present

## 2018-09-30 DIAGNOSIS — Z0001 Encounter for general adult medical examination with abnormal findings: Secondary | ICD-10-CM | POA: Diagnosis not present

## 2018-09-30 DIAGNOSIS — H6123 Impacted cerumen, bilateral: Secondary | ICD-10-CM | POA: Diagnosis not present

## 2018-09-30 DIAGNOSIS — Z3041 Encounter for surveillance of contraceptive pills: Secondary | ICD-10-CM | POA: Diagnosis not present

## 2018-09-30 DIAGNOSIS — Z3202 Encounter for pregnancy test, result negative: Secondary | ICD-10-CM | POA: Diagnosis not present

## 2018-10-02 ENCOUNTER — Other Ambulatory Visit (HOSPITAL_BASED_OUTPATIENT_CLINIC_OR_DEPARTMENT_OTHER): Payer: Self-pay | Admitting: Gastroenterology

## 2018-10-02 DIAGNOSIS — R932 Abnormal findings on diagnostic imaging of liver and biliary tract: Secondary | ICD-10-CM

## 2018-10-02 DIAGNOSIS — K769 Liver disease, unspecified: Secondary | ICD-10-CM

## 2018-10-05 DIAGNOSIS — F4323 Adjustment disorder with mixed anxiety and depressed mood: Secondary | ICD-10-CM | POA: Diagnosis not present

## 2018-10-07 DIAGNOSIS — H5203 Hypermetropia, bilateral: Secondary | ICD-10-CM | POA: Diagnosis not present

## 2018-10-08 DIAGNOSIS — R2241 Localized swelling, mass and lump, right lower limb: Secondary | ICD-10-CM | POA: Diagnosis not present

## 2018-10-10 ENCOUNTER — Ambulatory Visit (HOSPITAL_BASED_OUTPATIENT_CLINIC_OR_DEPARTMENT_OTHER)
Admission: RE | Admit: 2018-10-10 | Discharge: 2018-10-10 | Disposition: A | Discharge status: Home / Self Care | Payer: 59 | Source: Ambulatory Visit | Attending: Gastroenterology | Admitting: Gastroenterology

## 2018-10-10 ENCOUNTER — Encounter (HOSPITAL_BASED_OUTPATIENT_CLINIC_OR_DEPARTMENT_OTHER): Payer: Self-pay

## 2018-10-10 DIAGNOSIS — R932 Abnormal findings on diagnostic imaging of liver and biliary tract: Secondary | ICD-10-CM

## 2018-10-15 ENCOUNTER — Other Ambulatory Visit: Payer: Self-pay | Admitting: Gastroenterology

## 2018-10-15 DIAGNOSIS — R932 Abnormal findings on diagnostic imaging of liver and biliary tract: Secondary | ICD-10-CM

## 2018-10-21 DIAGNOSIS — F4323 Adjustment disorder with mixed anxiety and depressed mood: Secondary | ICD-10-CM | POA: Diagnosis not present

## 2018-10-23 DIAGNOSIS — R2241 Localized swelling, mass and lump, right lower limb: Secondary | ICD-10-CM | POA: Diagnosis not present

## 2018-10-25 ENCOUNTER — Ambulatory Visit
Admission: RE | Admit: 2018-10-25 | Discharge: 2018-10-25 | Disposition: A | Discharge status: Home / Self Care | Payer: 59 | Source: Ambulatory Visit | Attending: Gastroenterology | Admitting: Gastroenterology

## 2018-10-25 DIAGNOSIS — K769 Liver disease, unspecified: Secondary | ICD-10-CM | POA: Diagnosis not present

## 2018-10-25 DIAGNOSIS — R932 Abnormal findings on diagnostic imaging of liver and biliary tract: Secondary | ICD-10-CM

## 2018-10-25 MED ORDER — GADOXETATE DISODIUM 0.25 MMOL/ML IV SOLN
8.0000 mL | Freq: Once | INTRAVENOUS | Status: AC | PRN
  Administered 2018-10-25: 8 mL via INTRAVENOUS

## 2018-11-05 DIAGNOSIS — Q2732 Arteriovenous malformation of vessel of lower limb: Secondary | ICD-10-CM | POA: Diagnosis not present

## 2018-11-05 DIAGNOSIS — Z01810 Encounter for preprocedural cardiovascular examination: Secondary | ICD-10-CM | POA: Diagnosis not present

## 2018-11-05 DIAGNOSIS — R2241 Localized swelling, mass and lump, right lower limb: Secondary | ICD-10-CM | POA: Diagnosis not present

## 2018-11-15 ENCOUNTER — Emergency Department (INDEPENDENT_AMBULATORY_CARE_PROVIDER_SITE_OTHER)
Admission: EM | Admit: 2018-11-15 | Discharge: 2018-11-15 | Disposition: A | Discharge status: Home / Self Care | Payer: Medicaid Other | Source: Home / Self Care

## 2018-11-15 ENCOUNTER — Encounter: Payer: Self-pay | Admitting: Emergency Medicine

## 2018-11-15 DIAGNOSIS — Z7689 Persons encountering health services in other specified circumstances: Secondary | ICD-10-CM | POA: Diagnosis not present

## 2018-11-15 DIAGNOSIS — Z4889 Encounter for other specified surgical aftercare: Secondary | ICD-10-CM

## 2018-11-15 NOTE — Discharge Instructions (Signed)
°  Please call your surgeon in the morning to see if they want to schedule an appointment sooner to re-examine the surgical site. There is no redness, swelling, warmth or drainage during exam today.  The area does not appear infected and looks to be healing well but it is always good to have open communication with your surgeon.

## 2018-11-15 NOTE — ED Triage Notes (Signed)
Patient had an arterialgram on 11/05/18 in the left femoral artery.  Patient began to have green drainage from site on Friday night, has kept area clean with saline.  Patient is concerned with infection and would like to start an antb.

## 2018-11-15 NOTE — ED Provider Notes (Signed)
[name]    CSN: [id number] Arrival date & time: 11/15/18  1733     History   Chief Complaint Chief Complaint  Patient presents with  . Possible Site Infection    HPI Leah Reid is a 43 y.o. female.   HPI Leah Reid is a 43 y.o. female presenting to UC with concern for possible infected site of Left femoral artery arterialgram performed on 11/05/2018. Pt reports noticing scant green drainage from the site 2 nights ago.  She cleaned it with saline and has not noticed any additional drainage since last night but pt wanted to be evaluated to see if she needed an antibiotic. Denies fever, chills, n/v/d. She has not called her surgeon.    Past Medical History:  Diagnosis Date  . Arthritis   . Hypertension   . Migraines   . Migraines     Patient Active Problem List   Diagnosis Date Noted  . Dental abscess 12/06/2016  . Nausea 11/28/2016  . Vertigo 11/28/2016  . Drug reaction 11/28/2016  . ACUTE MAXILLARY SINUSITIS 06/11/2011  . ACUTE PHARYNGITIS 06/11/2011  . DERMATOPHYTOSIS OF THE BODY 10/28/2008  . CELLULITIS, GROIN, LEFT 10/28/2008  . [place] SYNDROME 10/17/2008  . HYPERBILIRUBINEMIA 10/17/2008  . BENIGN NEOPLASM OF LIVER AND BILIARY PASSAGES 10/07/2008  . HYPERCHOLESTEROLEMIA 10/07/2008  . COMMON MIGRAINE 10/07/2008  . GERD 10/07/2008  . CONSTIPATION, CHRONIC 10/07/2008  . OSTEOARTHRITIS, GENERALIZED, MULTIPLE JOINTS 10/07/2008  . FIBROMYALGIA 10/07/2008  . ABDOMINAL PAIN, CHRONIC 10/07/2008  . HYPERTENSION 09/05/2008  . FOLLICULITIS 70/35/0093    Past Surgical History:  Procedure Laterality Date  . DENTAL SURGERY    . TONSILLECTOMY    . WISDOM TOOTH EXTRACTION      OB History   No obstetric history on file.      Home Medications    Prior to Admission medications   Medication Sig Start Date End Date Taking? Authorizing Provider  B Complex Vitamins (BL VITAMIN B COMPLEX PO) Take by mouth.    [provider]    BIOTIN PO Take 1 tablet by mouth. biotin-keratin (BIOTIN PLUS KERATIN) 10,000-100 mcg-mg Tab    [provider]  calcium citrate-vitamin D (CITRACAL+D) 315-200 MG-UNIT tablet Take by mouth.    [provider]  Flaxseed Oil (LINSEED OIL) OIL 1,400 mg by Misc.(Non-Drug; Combo Route) route.    [provider]  losartan (COZAAR) 50 MG tablet Take by mouth. 08/26/18   [provider]  Milk Thistle (RA MILK THISTLE) 200 MG CAPS Take by mouth.    [provider]  Multiple Vitamin (MULTIVITAMIN) tablet Take 1 tablet by mouth daily.    [provider]  norethindrone-ethinyl estradiol (CYCLAFEM,ALYACEN) 0.5/0.75/1-35 MG-MCG tablet TAKE 1 TABLET BY MOUTH EVERY DAY 05/08/18   [provider]  OVER THE COUNTER MEDICATION keratin    [provider]  rizatriptan (MAXALT) 10 MG tablet Take at onset of HA. May repeat in 2 hours if needed but no more than 2 in 24 hours. 05/28/18   [provider]  Vitamin D, Ergocalciferol, (DRISDOL) 1.25 MG (50000 UT) CAPS capsule Take by mouth. 01/16/15   [provider]  zinc gluconate 50 MG tablet Take 50 mg by mouth daily.    [provider]  zonisamide (ZONEGRAN) 100 MG capsule  07/03/18   [provider]    Family History Family History  Problem Relation Age of Onset  . Lung cancer Father   . Hypertension Maternal Grandmother   .  Hyperlipidemia Maternal Grandmother   . Stroke Maternal Grandmother   . Diabetes Maternal Grandmother     Social History Social History   Tobacco Use  . Smoking status: Never Smoker  . Smokeless tobacco: Never Used  Substance Use Topics  . Alcohol use: Yes    Comment: rare  . Drug use: No     Allergies   Fish allergy and Metronidazole   Review of Systems Review of Systems  Constitutional: Negative for chills and fever.  Skin: Positive for wound. Negative for color change.     Physical Exam Triage Vital Signs ED  Triage Vitals  Enc Vitals Group     BP 11/15/18 1758 (!) 146/95     Pulse Rate 11/15/18 1758 73     Resp --      Temp 11/15/18 1758 98 F (36.7 C)     Temp Source 11/15/18 1758 Oral     SpO2 11/15/18 1758 98 %     Weight 11/15/18 1759 174 lb 4 oz (79 kg)     Height 11/15/18 1759 5' (1.524 m)     Head Circumference --      Peak Flow --      Pain Score 11/15/18 1759 0     Pain Loc --      Pain Edu? --      Excl. in [place]? --    No data found.  Updated Vital Signs BP (!) 146/95 (BP Location: Right Arm)   Pulse 73   Temp 98 F (36.7 C) (Oral)   Ht 5' (1.524 m)   Wt 174 lb 4 oz (79 kg)   SpO2 98%   BMI 34.03 kg/m   Visual Acuity Right Eye Distance:   Left Eye Distance:   Bilateral Distance:    Right Eye Near:   Left Eye Near:    Bilateral Near:     Physical Exam Vitals signs and nursing note reviewed.  Constitutional:      Appearance: She is well-developed.  HENT:     Head: Normocephalic and atraumatic.  Neck:     Musculoskeletal: Normal range of motion.  Cardiovascular:     Rate and Rhythm: Normal rate.  Pulmonary:     Effort: Pulmonary effort is normal.  Musculoskeletal: Normal range of motion.  Skin:    General: Skin is warm and dry.     Findings: No erythema.     Comments: Left groin: 1cm well healing surgical site. No redness, warmth, edema, tenderness. Skin appears in tact. No drainage or bleeding. No scab.   Neurological:     Mental Status: She is alert and oriented to person, place, and time.  Psychiatric:        Behavior: Behavior normal.      UC Treatments / Results  Labs (all labs ordered are listed, but only abnormal results are displayed) Labs Reviewed - No data to display  EKG None  Radiology No results found.  Procedures Procedures (including critical care time)  Medications Ordered in UC Medications - No data to display  Initial Impression / Assessment and Plan / UC Course  I have reviewed the triage vital signs and the  nursing notes.  Pertinent labs & imaging results that were available during my care of the patient were reviewed by me and considered in my medical decision making (see chart for details).     Surgical site appears clean and nearly completely healed. No evidence of infection. Reassured pt. Encouraged to call her surgeon  in the morning for advise on possibly scheduling a sooner f/u exam.  AVS provided  Final Clinical Impressions(s) / UC Diagnoses   Final diagnoses:  Encounter for examination of surgical site     Discharge Instructions      Please call your surgeon in the morning to see if they want to schedule an appointment sooner to re-examine the surgical site. There is no redness, swelling, warmth or drainage during exam today.  The area does not appear infected and looks to be healing well but it is always good to have open communication with your surgeon.     ED Prescriptions    None     Controlled Substance Prescriptions [place] Controlled Substance Registry consulted? Not Applicable   Tyrell Antonio 11/15/18 2446

## 2018-12-01 ENCOUNTER — Encounter: Payer: Self-pay | Admitting: Emergency Medicine

## 2018-12-01 ENCOUNTER — Other Ambulatory Visit: Payer: Self-pay

## 2018-12-01 ENCOUNTER — Emergency Department (INDEPENDENT_AMBULATORY_CARE_PROVIDER_SITE_OTHER): Payer: Medicaid Other

## 2018-12-01 ENCOUNTER — Emergency Department (INDEPENDENT_AMBULATORY_CARE_PROVIDER_SITE_OTHER)
Admission: EM | Admit: 2018-12-01 | Discharge: 2018-12-01 | Disposition: A | Discharge status: Home / Self Care | Payer: Medicaid Other | Source: Home / Self Care | Attending: Family Medicine | Admitting: Family Medicine

## 2018-12-01 DIAGNOSIS — M5011 Cervical disc disorder with radiculopathy,  high cervical region: Secondary | ICD-10-CM

## 2018-12-01 DIAGNOSIS — M5412 Radiculopathy, cervical region: Secondary | ICD-10-CM | POA: Diagnosis not present

## 2018-12-01 MED ORDER — PREDNISONE 20 MG PO TABS: ORAL_TABLET | ORAL | 0 refills | Status: DC

## 2018-12-01 MED ORDER — HYDROCODONE-ACETAMINOPHEN 5-325 MG PO TABS: ORAL_TABLET | ORAL | 0 refills | Status: DC

## 2018-12-01 NOTE — ED Triage Notes (Signed)
Pt c/o right neck and shoulder pain that started 2 days ago. Pt denies any trauma. Describes pain as constant.

## 2018-12-01 NOTE — ED Provider Notes (Signed)
Leah [facility] C[facility]    CSN: [id number] Arrival date & time: 12/01/18  1400     History   Chief Complaint Chief Complaint  Patient presents with  . Neck Pain  . Shoulder Pain    HPI Leah Reid is a 43 y.o. female.   Patient developed pain in her right shoulder and neck two days ago which became worse yesterday.  She recalls no injury.  The history is provided by the patient.  Neck Pain  Pain location:  R side Quality:  Aching Pain radiates to:  R shoulder Pain severity:  Moderate Pain is:  Same all the time Onset quality:  Sudden Duration:  2 days Timing:  Constant Progression:  Worsening Chronicity:  New Context: not fall, not lifting a heavy object, not MVC and not recent injury   Relieved by:  Nothing Exacerbated by: neck movement. Ineffective treatments:  Ice and NSAIDs Associated symptoms: no fever, no headaches, no numbness and no tingling   Shoulder Pain  Associated symptoms: neck pain   Associated symptoms: no fever     Past Medical History:  Diagnosis Date  . Arthritis   . Hypertension   . Migraines   . Migraines     Patient Active Problem List   Diagnosis Date Noted  . Dental abscess 12/06/2016  . Nausea 11/28/2016  . Vertigo 11/28/2016  . Drug reaction 11/28/2016  . ACUTE MAXILLARY SINUSITIS 06/11/2011  . ACUTE PHARYNGITIS 06/11/2011  . DERMATOPHYTOSIS OF THE BODY 10/28/2008  . CELLULITIS, GROIN, LEFT 10/28/2008  . [place] SYNDROME 10/17/2008  . HYPERBILIRUBINEMIA 10/17/2008  . BENIGN NEOPLASM OF LIVER AND BILIARY PASSAGES 10/07/2008  . HYPERCHOLESTEROLEMIA 10/07/2008  . COMMON MIGRAINE 10/07/2008  . GERD 10/07/2008  . CONSTIPATION, CHRONIC 10/07/2008  . OSTEOARTHRITIS, GENERALIZED, MULTIPLE JOINTS 10/07/2008  . FIBROMYALGIA 10/07/2008  . ABDOMINAL PAIN, CHRONIC 10/07/2008  . HYPERTENSION 09/05/2008  . FOLLICULITIS 61/60/7371    Past Surgical History:  Procedure Laterality Date  . DENTAL SURGERY    . TONSILLECTOMY     . WISDOM TOOTH EXTRACTION      OB History   No obstetric history on file.      Home Medications    Prior to Admission medications   Medication Sig Start Date End Date Taking? Authorizing Provider  B Complex Vitamins (BL VITAMIN B COMPLEX PO) Take by mouth.   Yes [provider]  BIOTIN PO Take 1 tablet by mouth. biotin-keratin (BIOTIN PLUS KERATIN) 10,000-100 mcg-mg Tab   Yes [provider]  calcium citrate-vitamin D (CITRACAL+D) 315-200 MG-UNIT tablet Take by mouth.   Yes [provider]  Flaxseed Oil (LINSEED OIL) OIL 1,400 mg by Misc.(Non-Drug; Combo Route) route.   Yes [provider]  losartan (COZAAR) 50 MG tablet Take by mouth. 08/26/18  Yes [provider]  Milk Thistle (RA MILK THISTLE) 200 MG CAPS Take by mouth.   Yes [provider]  Multiple Vitamin (MULTIVITAMIN) tablet Take 1 tablet by mouth daily.   Yes [provider]  norethindrone-ethinyl estradiol (CYCLAFEM,ALYACEN) 0.5/0.75/1-35 MG-MCG tablet TAKE 1 TABLET BY MOUTH EVERY DAY 05/08/18  Yes [provider]  OVER THE COUNTER MEDICATION keratin   Yes [provider]  rizatriptan (MAXALT) 10 MG tablet Take at onset of HA. May repeat in 2 hours if needed but no more than 2 in 24 hours. 05/28/18  Yes [provider]  Vitamin D, Ergocalciferol, (DRISDOL) 1.25 MG (50000 UT) CAPS capsule Take by mouth. 01/16/15  Yes [provider]  zinc gluconate 50 MG tablet Take 50 mg by mouth daily.   Yes [provider]  zonisamide (ZONEGRAN) 100 MG capsule  07/03/18  Yes [provider]  HYDROcodone-acetaminophen (NORCO/VICODIN) 5-325 MG tablet Take one by mouth at bedtime as needed for pain 12/01/18   Kandra Nicolas, MD  predniSONE (DELTASONE) 20 MG tablet Take one tab by mouth twice daily for 4 days, then one daily. Take with food. 12/01/18   Kandra Nicolas, MD    Family History Family History  Problem Relation Age of  Onset  . Lung cancer Father   . Hypertension Maternal Grandmother   . Hyperlipidemia Maternal Grandmother   . Stroke Maternal Grandmother   . Diabetes Maternal Grandmother     Social History Social History   Tobacco Use  . Smoking status: Never Smoker  . Smokeless tobacco: Never Used  Substance Use Topics  . Alcohol use: Yes    Comment: rare  . Drug use: No     Allergies   Fish allergy and Metronidazole   Review of Systems Review of Systems  Constitutional: Negative for fever.  Musculoskeletal: Positive for neck pain.  Neurological: Negative for tingling, numbness and headaches.  All other systems reviewed and are negative.    Physical Exam Triage Vital Signs ED Triage Vitals [12/01/18 1427]  Enc Vitals Group     BP (!) 144/99     Pulse Rate 75     Resp 16     Temp 97.6 F (36.4 C)     Temp Source Oral     SpO2 99 %     Weight 173 lb 6.4 oz (78.7 kg)     Height 5' (1.524 m)     Head Circumference      Peak Flow      Pain Score 8     Pain Loc      Pain Edu?      Excl. in [place]?    No data found.  Updated Vital Signs BP (!) 144/99 (BP Location: Right Arm)   Pulse 75   Temp 97.6 F (36.4 C) (Oral)   Resp 16   Ht 5' (1.524 m)   Wt 78.7 kg   LMP 11/18/2018   SpO2 99%   BMI 33.86 kg/m   Visual Acuity Right Eye Distance:   Left Eye Distance:   Bilateral Distance:    Right Eye Near:   Left Eye Near:    Bilateral Near:     Physical Exam Vitals signs and nursing note reviewed.  Constitutional:      Appearance: She is not ill-appearing.  HENT:     Head: Normocephalic.     Right Ear: External ear normal.     Left Ear: External ear normal.     Nose: Nose normal.  Eyes:     Pupils: Pupils are equal, round, and reactive to light.  Neck:      Comments: Decreased full range of motion.  There is mild tenderness to palpation right trapezius muscle.  Patient has pain in right trapezius area with rotation of head, and with flexion of neck.  Distal  neurovascular function is intact.   Cardiovascular:     Rate and Rhythm: Normal rate.     Heart sounds: Normal heart sounds.  Pulmonary:     Breath sounds: Normal breath sounds.  Lymphadenopathy:     Cervical: No cervical adenopathy.  Skin:    General: Skin is warm and dry.  Findings: No rash.  Neurological:     Mental Status: She is alert.      UC Treatments / Results  Labs (all labs ordered are listed, but only abnormal results are displayed) Labs Reviewed - No data to display  EKG None  Radiology Dg Cervical Spine Complete  Result Date: 12/01/2018 CLINICAL DATA:  Neck and left shoulder pain for the past 2 days. No injury. EXAM: CERVICAL SPINE - COMPLETE 4+ VIEW COMPARISON:  None. FINDINGS: The lateral view is diagnostic to the C7-T1 level. There is no acute fracture or subluxation. Vertebral body heights are preserved. Alignment is normal. Mild disc height loss and endplate spurring at D2-K0 and C4-C5. No neuroforaminal stenosis.Normal prevertebral soft tissues. IMPRESSION: 1.  No acute osseous abnormality. 2. Mild degenerative disc disease at C3-C4 and C4-C5. Electronically Signed   By: Titus Dubin M.D.   On: 12/01/2018 15:01    Procedures Procedures (including critical care time)  Medications Ordered in UC Medications - No data to display  Initial Impression / Assessment and Plan / UC Course  I have reviewed the triage vital signs and the nursing notes.  Pertinent labs & imaging results that were available during my care of the patient were reviewed by me and considered in my medical decision making (see chart for details).    Begin prednisone burst/taper. Rx Lortab (#5, no refill) for pain at night. Controlled Substance Prescriptions I have consulted the [place] Controlled Substances Registry for this patient, and feel the risk/benefit ratio today is favorable for proceeding with this prescription for a controlled substance.   Followup with Dr. Aundria Mems or Dr. Lynne Leader ([facility]) if not improving about two weeks.    Final Clinical Impressions(s) / UC Diagnoses   Final diagnoses:  Cervical radicular pain     Discharge Instructions     Apply ice pack for 20 to 30 minutes, 3 to 4 times daily  Continue until pain and swelling decrease.  May take Tylenol during the day if needed for  pain.    ED Prescriptions    Medication Sig Dispense Auth. Provider   predniSONE (DELTASONE) 20 MG tablet Take one tab by mouth twice daily for 4 days, then one daily. Take with food. 12 tablet Kandra Nicolas, MD   HYDROcodone-acetaminophen (NORCO/VICODIN) 5-325 MG tablet Take one by mouth at bedtime as needed for pain 5 tablet Kandra Nicolas, MD         Kandra Nicolas, MD 12/01/18 [phone]

## 2018-12-01 NOTE — Discharge Instructions (Addendum)
Apply ice pack for 20 to 30 minutes, 3 to 4 times daily  Continue until pain and swelling decrease.  May take Tylenol during the day if needed for  pain.

## 2018-12-01 NOTE — ED Notes (Signed)
Pt ambulatory upon discharge. NAD noted; chest rise and fall symmetrically; Gait steady; alert and oriented x 4.

## 2019-01-17 ENCOUNTER — Other Ambulatory Visit: Payer: Self-pay

## 2019-01-17 ENCOUNTER — Encounter (HOSPITAL_BASED_OUTPATIENT_CLINIC_OR_DEPARTMENT_OTHER): Payer: Self-pay | Admitting: Emergency Medicine

## 2019-01-17 ENCOUNTER — Emergency Department (HOSPITAL_BASED_OUTPATIENT_CLINIC_OR_DEPARTMENT_OTHER)
Admission: EM | Admit: 2019-01-17 | Discharge: 2019-01-17 | Disposition: A | Discharge status: Home / Self Care | Payer: Medicaid Other | Attending: Emergency Medicine | Admitting: Emergency Medicine

## 2019-01-17 DIAGNOSIS — I10 Essential (primary) hypertension: Secondary | ICD-10-CM | POA: Diagnosis not present

## 2019-01-17 DIAGNOSIS — Z79899 Other long term (current) drug therapy: Secondary | ICD-10-CM | POA: Diagnosis not present

## 2019-01-17 DIAGNOSIS — G43829 Menstrual migraine, not intractable, without status migrainosus: Secondary | ICD-10-CM | POA: Diagnosis not present

## 2019-01-17 DIAGNOSIS — R51 Headache: Secondary | ICD-10-CM | POA: Diagnosis present

## 2019-01-17 LAB — URINALYSIS, MICROSCOPIC (REFLEX)

## 2019-01-17 LAB — URINALYSIS, ROUTINE W REFLEX MICROSCOPIC
Bilirubin Urine: NEGATIVE
Glucose, UA: NEGATIVE mg/dL
Ketones, ur: NEGATIVE mg/dL
Leukocytes,Ua: NEGATIVE
Nitrite: NEGATIVE
Protein, ur: NEGATIVE mg/dL
Specific Gravity, Urine: 1.02 (ref 1.005–1.030)
pH: 8.5 — ABNORMAL HIGH (ref 5.0–8.0)

## 2019-01-17 LAB — PREGNANCY, URINE: Preg Test, Ur: NEGATIVE

## 2019-01-17 MED ORDER — ONDANSETRON HCL 4 MG/2ML IJ SOLN
4.0000 mg | Freq: Once | INTRAMUSCULAR | Status: AC
  Administered 2019-01-17: 10:00:00 4 mg via INTRAVENOUS
  Filled 2019-01-17: qty 2

## 2019-01-17 MED ORDER — SODIUM CHLORIDE 0.9 % IV BOLUS
500.0000 mL | Freq: Once | INTRAVENOUS | Status: AC
  Administered 2019-01-17: 10:00:00 500 mL via INTRAVENOUS

## 2019-01-17 MED ORDER — ONDANSETRON 4 MG PO TBDP: 4.0000 mg | ORAL_TABLET | Freq: Three times a day (TID) | ORAL | 0 refills | Status: DC | PRN

## 2019-01-17 MED ORDER — KETOROLAC TROMETHAMINE 30 MG/ML IJ SOLN
30.0000 mg | Freq: Once | INTRAMUSCULAR | Status: AC
  Administered 2019-01-17: 11:00:00 30 mg via INTRAVENOUS
  Filled 2019-01-17: qty 1

## 2019-01-17 MED ORDER — DEXAMETHASONE SODIUM PHOSPHATE 10 MG/ML IJ SOLN
10.0000 mg | Freq: Once | INTRAMUSCULAR | Status: AC
  Administered 2019-01-17: 10:00:00 10 mg via INTRAVENOUS
  Filled 2019-01-17: qty 1

## 2019-01-17 NOTE — Discharge Instructions (Addendum)
Take Zofran every 8 hours as needed for nausea or vomiting.  Please talk to your doctor if you continue to have persistent migraines that are not responsive to your medication.  Please return the emergency department if you develop any new or worsening symptoms.

## 2019-01-17 NOTE — ED Triage Notes (Signed)
Pt reports migraine since 2 am with history of migraines. Took her usual meds without relief. Also endorses vomiting.

## 2019-01-17 NOTE — ED Provider Notes (Addendum)
[facility] EMERGENCY DEPARTMENT Provider Note   CSN: [id number] Arrival date & time: 01/17/19  2355    History   Chief Complaint Chief Complaint  Patient presents with  . Headache    HPI Leah Reid is a 43 y.o. female with history of hypertension, migraines who presents with headache that began around 2 AM this morning.  It was a gradual onset.  She has had associated nausea, vomiting, and photophobia.  It is typical of her normal migraines, however usually she is relieved with her Maxalt at home.  She took this and Tylenol and did not have any relief.  She reports she began having the nausea and vomiting, which is usually an indicator that it is not going to get better.  Patient denies any abdominal pain, chest pain, shortness of breath, fever, neck pain, vision changes, numbness or tingling.     HPI  Past Medical History:  Diagnosis Date  . Arthritis   . Hypertension   . Migraines   . Migraines     Patient Active Problem List   Diagnosis Date Noted  . Dental abscess 12/06/2016  . Nausea 11/28/2016  . Vertigo 11/28/2016  . Drug reaction 11/28/2016  . ACUTE MAXILLARY SINUSITIS 06/11/2011  . ACUTE PHARYNGITIS 06/11/2011  . DERMATOPHYTOSIS OF THE BODY 10/28/2008  . CELLULITIS, GROIN, LEFT 10/28/2008  . [place] SYNDROME 10/17/2008  . HYPERBILIRUBINEMIA 10/17/2008  . BENIGN NEOPLASM OF LIVER AND BILIARY PASSAGES 10/07/2008  . HYPERCHOLESTEROLEMIA 10/07/2008  . COMMON MIGRAINE 10/07/2008  . GERD 10/07/2008  . CONSTIPATION, CHRONIC 10/07/2008  . OSTEOARTHRITIS, GENERALIZED, MULTIPLE JOINTS 10/07/2008  . FIBROMYALGIA 10/07/2008  . ABDOMINAL PAIN, CHRONIC 10/07/2008  . HYPERTENSION 09/05/2008  . FOLLICULITIS 73/22/0254    Past Surgical History:  Procedure Laterality Date  . DENTAL SURGERY    . TONSILLECTOMY    . WISDOM TOOTH EXTRACTION       OB History   No obstetric history on file.      Home Medications    Prior to Admission  medications   Medication Sig Start Date End Date Taking? Authorizing Provider  B Complex Vitamins (BL VITAMIN B COMPLEX PO) Take by mouth.    [provider]  BIOTIN PO Take 1 tablet by mouth. biotin-keratin (BIOTIN PLUS KERATIN) 10,000-100 mcg-mg Tab    [provider]  calcium citrate-vitamin D (CITRACAL+D) 315-200 MG-UNIT tablet Take by mouth.    [provider]  Flaxseed Oil (LINSEED OIL) OIL 1,400 mg by Misc.(Non-Drug; Combo Route) route.    [provider]  HYDROcodone-acetaminophen (NORCO/VICODIN) 5-325 MG tablet Take one by mouth at bedtime as needed for pain 12/01/18   Kandra Nicolas, MD  losartan (COZAAR) 50 MG tablet Take by mouth. 08/26/18   [provider]  Milk Thistle (RA MILK THISTLE) 200 MG CAPS Take by mouth.    [provider]  Multiple Vitamin (MULTIVITAMIN) tablet Take 1 tablet by mouth daily.    [provider]  norethindrone-ethinyl estradiol (CYCLAFEM,ALYACEN) 0.5/0.75/1-35 MG-MCG tablet TAKE 1 TABLET BY MOUTH EVERY DAY 05/08/18   [provider]  ondansetron (ZOFRAN ODT) 4 MG disintegrating tablet Take 1 tablet (4 mg total) by mouth every 8 (eight) hours as needed for nausea or vomiting. 01/17/19   , Bea Graff, PA-C  OVER THE COUNTER MEDICATION keratin    [provider]  predniSONE (DELTASONE) 20 MG tablet Take one tab by mouth twice daily for 4 days, then one daily. Take with food. 12/01/18   Beese,  Ishmael Holter, MD  rizatriptan (MAXALT) 10 MG tablet Take at onset of HA. May repeat in 2 hours if needed but no more than 2 in 24 hours. 05/28/18   [provider]  Vitamin D, Ergocalciferol, (DRISDOL) 1.25 MG (50000 UT) CAPS capsule Take by mouth. 01/16/15   [provider]  zinc gluconate 50 MG tablet Take 50 mg by mouth daily.    [provider]  zonisamide (ZONEGRAN) 100 MG capsule  07/03/18   [provider]    Family History Family History  Problem  Relation Age of Onset  . Lung cancer Father   . Hypertension Maternal Grandmother   . Hyperlipidemia Maternal Grandmother   . Stroke Maternal Grandmother   . Diabetes Maternal Grandmother     Social History Social History   Tobacco Use  . Smoking status: Never Smoker  . Smokeless tobacco: Never Used  Substance Use Topics  . Alcohol use: Yes    Comment: rare  . Drug use: No     Allergies   Fish allergy and Metronidazole   Review of Systems Review of Systems  Constitutional: Negative for chills and fever.  HENT: Negative for facial swelling and sore throat.   Eyes: Positive for photophobia. Negative for visual disturbance.  Respiratory: Negative for shortness of breath.   Cardiovascular: Negative for chest pain.  Gastrointestinal: Positive for nausea and vomiting. Negative for abdominal pain.  Genitourinary: Negative for dysuria.  Musculoskeletal: Negative for back pain, neck pain and neck stiffness.  Skin: Negative for rash and wound.  Neurological: Positive for headaches. Negative for weakness and numbness.  Psychiatric/Behavioral: The patient is not nervous/anxious.      Physical Exam Updated Vital Signs BP (!) 143/102 (BP Location: Right Arm)   Pulse 77   Temp 98.3 F (36.8 C) (Oral)   Resp 18   Ht 5' (1.524 m)   Wt 76.2 kg   SpO2 100%   BMI 32.81 kg/m   Physical Exam Vitals signs and nursing note reviewed.  Constitutional:      General: She is not in acute distress.    Appearance: She is well-developed. She is not diaphoretic.  HENT:     Head: Normocephalic and atraumatic.     Mouth/Throat:     Pharynx: No oropharyngeal exudate.  Eyes:     General: No scleral icterus.       Right eye: No discharge.        Left eye: No discharge.     Extraocular Movements: Extraocular movements intact.     Right eye: No nystagmus.     Left eye: No nystagmus.     Conjunctiva/sclera: Conjunctivae normal.     Pupils: Pupils are equal, round, and reactive to light.   Neck:     Musculoskeletal: Normal range of motion and neck supple.     Thyroid: No thyromegaly.  Cardiovascular:     Rate and Rhythm: Normal rate and regular rhythm.     Heart sounds: Normal heart sounds. No murmur. No friction rub. No gallop.   Pulmonary:     Effort: Pulmonary effort is normal. No respiratory distress.     Breath sounds: Normal breath sounds. No stridor. No wheezing or rales.  Abdominal:     General: Bowel sounds are normal. There is no distension.     Palpations: Abdomen is soft.     Tenderness: There is no abdominal tenderness. There is no guarding or rebound.  Lymphadenopathy:     Cervical: No cervical adenopathy.  Skin:    General: Skin is warm and dry.     Coloration: Skin is not pale.     Findings: No rash.  Neurological:     Mental Status: She is alert.     GCS: GCS eye subscore is 4. GCS verbal subscore is 5. GCS motor subscore is 6.     Cranial Nerves: No cranial nerve deficit or facial asymmetry.     Coordination: Coordination normal.     Comments: CN 3-12 intact; normal sensation throughout; 5/5 strength in all 4 extremities; equal bilateral grip strength      ED Treatments / Results  Labs (all labs ordered are listed, but only abnormal results are displayed) Labs Reviewed  URINALYSIS, ROUTINE W REFLEX MICROSCOPIC - Abnormal; Notable for the following components:      Result Value   Color, Urine STRAW (*)    APPearance CLOUDY (*)    pH 8.5 (*)    Hgb urine dipstick MODERATE (*)    All other components within normal limits  URINALYSIS, MICROSCOPIC (REFLEX) - Abnormal; Notable for the following components:   Bacteria, UA MANY (*)    All other components within normal limits  PREGNANCY, URINE    EKG None  Radiology No results found.  Procedures Procedures (including critical care time)  Medications Ordered in ED Medications  sodium chloride 0.9 % bolus 500 mL (0 mLs Intravenous Stopped 01/17/19 1136)  ondansetron (ZOFRAN) injection  4 mg (4 mg Intravenous Given 01/17/19 1028)  ketorolac (TORADOL) 30 MG/ML injection 30 mg (30 mg Intravenous Given 01/17/19 1037)  dexamethasone (DECADRON) injection 10 mg (10 mg Intravenous Given 01/17/19 1028)     Initial Impression / Assessment and Plan / ED Course  I have reviewed the triage vital signs and the nursing notes.  Pertinent labs & imaging results that were available during my care of the patient were reviewed by me and considered in my medical decision making (see chart for details).        Pt HA treated and improved while in ED.  Presentation is like pts typical HA and non concerning for [facility], ICH, Meningitis, or temporal arteritis. Pt is afebrile with no focal neuro deficits, nuchal rigidity, or change in vision.  Patient advised to follow-up with PCP if she continues to have migraines unresponsive to her migraine medication she has at home.  Will discharge home with short course of Zofran as needed.  UA sent due to cloudy urine, however patient has no urinary symptoms.  UA shows many bacteria and moderate hematuria, however patient is on menstrual cycle.  Suspect asymptomatic bacteriuria and will not treat at this time.  Pt verbalizes understanding and is agreeable with plan to dc.  Patient blood pressure elevated, however she vomited her losartan this morning.  Patient took another dose in the ED and is tolerating oral fluids.   Final Clinical Impressions(s) / ED Diagnoses   Final diagnoses:  Menstrual migraine without status migrainosus, not intractable    ED Discharge Orders         Ordered    ondansetron (ZOFRAN ODT) 4 MG disintegrating tablet  Every 8 hours PRN     01/17/19 [address], [place], PA-C 01/17/19 1152    Malvin Johns, MD 01/17/19 1200

## 2019-01-17 NOTE — ED Notes (Signed)
ED Provider at bedside. 

## 2019-08-04 ENCOUNTER — Other Ambulatory Visit: Payer: Self-pay

## 2019-08-04 DIAGNOSIS — Z20822 Contact with and (suspected) exposure to covid-19: Secondary | ICD-10-CM

## 2019-08-06 LAB — NOVEL CORONAVIRUS, NAA: SARS-CoV-2, NAA: NOT DETECTED

## 2019-09-29 DIAGNOSIS — Z1231 Encounter for screening mammogram for malignant neoplasm of breast: Secondary | ICD-10-CM | POA: Diagnosis not present

## 2019-10-12 DIAGNOSIS — E785 Hyperlipidemia, unspecified: Secondary | ICD-10-CM | POA: Diagnosis not present

## 2019-10-12 DIAGNOSIS — E0590 Thyrotoxicosis, unspecified without thyrotoxic crisis or storm: Secondary | ICD-10-CM | POA: Diagnosis not present

## 2019-10-12 DIAGNOSIS — E559 Vitamin D deficiency, unspecified: Secondary | ICD-10-CM | POA: Diagnosis not present

## 2019-10-12 DIAGNOSIS — H5203 Hypermetropia, bilateral: Secondary | ICD-10-CM | POA: Diagnosis not present

## 2019-10-12 DIAGNOSIS — R5383 Other fatigue: Secondary | ICD-10-CM | POA: Diagnosis not present

## 2019-10-12 DIAGNOSIS — E7800 Pure hypercholesterolemia, unspecified: Secondary | ICD-10-CM | POA: Diagnosis not present

## 2019-10-19 DIAGNOSIS — Z1389 Encounter for screening for other disorder: Secondary | ICD-10-CM | POA: Diagnosis not present

## 2019-10-19 DIAGNOSIS — H6123 Impacted cerumen, bilateral: Secondary | ICD-10-CM | POA: Diagnosis not present

## 2019-10-19 DIAGNOSIS — E559 Vitamin D deficiency, unspecified: Secondary | ICD-10-CM | POA: Diagnosis not present

## 2019-10-19 DIAGNOSIS — Z0000 Encounter for general adult medical examination without abnormal findings: Secondary | ICD-10-CM | POA: Diagnosis not present

## 2019-11-16 ENCOUNTER — Ambulatory Visit: Payer: Medicaid Other | Attending: Internal Medicine

## 2019-11-16 DIAGNOSIS — Z23 Encounter for immunization: Secondary | ICD-10-CM | POA: Insufficient documentation

## 2019-11-16 NOTE — Progress Notes (Signed)
   Covid-19 Vaccination Clinic  Name:  Leah Reid    MRN: [id number] DOB: 11/08/1975  11/16/2019  Ms. [name] was observed post Covid-19 immunization for 15 minutes without incident. She was provided with Vaccine Information Sheet and instruction to access the V-Safe system.   Ms. [name] was instructed to call 911 with any severe reactions post vaccine: Marland Kitchen Difficulty breathing  . Swelling of face and throat  . A fast heartbeat  . A bad rash all over body  . Dizziness and weakness   Immunizations Administered    Name Date Dose VIS Date Route   Pfizer COVID-19 Vaccine 11/16/2019  4:50 PM 0.3 mL 08/20/2019 Intramuscular   Manufacturer: [place]   Lot: [id number]   [place]: [id number]

## 2019-11-29 DIAGNOSIS — G43009 Migraine without aura, not intractable, without status migrainosus: Secondary | ICD-10-CM | POA: Diagnosis not present

## 2019-11-29 DIAGNOSIS — G479 Sleep disorder, unspecified: Secondary | ICD-10-CM | POA: Diagnosis not present

## 2019-12-07 ENCOUNTER — Ambulatory Visit: Payer: Medicaid Other | Attending: Internal Medicine

## 2019-12-07 DIAGNOSIS — Z23 Encounter for immunization: Secondary | ICD-10-CM

## 2019-12-07 NOTE — Progress Notes (Signed)
   Covid-19 Vaccination Clinic  Name:  Leah Reid    MRN: [id number] DOB: Jan 02, 1976  12/07/2019  Ms. [name] was observed post Covid-19 immunization for 15 minutes without incident. She was provided with Vaccine Information Sheet and instruction to access the V-Safe system.   Ms. [name] was instructed to call 911 with any severe reactions post vaccine: Marland Kitchen Difficulty breathing  . Swelling of face and throat  . A fast heartbeat  . A bad rash all over body  . Dizziness and weakness   Immunizations Administered    Name Date Dose VIS Date Route   Pfizer COVID-19 Vaccine 12/07/2019  4:10 PM 0.3 mL 08/20/2019 Intramuscular   Manufacturer: [place]   Lot: [id number]   [place]: [id number]

## 2020-01-11 DIAGNOSIS — Q2732 Arteriovenous malformation of vessel of lower limb: Secondary | ICD-10-CM | POA: Diagnosis not present

## 2020-01-11 DIAGNOSIS — Q2730 Arteriovenous malformation, site unspecified: Secondary | ICD-10-CM | POA: Diagnosis not present

## 2020-01-11 DIAGNOSIS — R252 Cramp and spasm: Secondary | ICD-10-CM | POA: Diagnosis not present

## 2020-01-31 IMAGING — US RIGHT LOWER EXTREMITY VENOUS ULTRASOUND
1 series · 13 of 24 positions shown · non-contrast
Comparison: None.

CLINICAL DATA: 41-year-old female with right calf pain
intermittently for the past 6-8 months but acutely worse today.



[Series 1: right lower extremity venous ultrasound · 0.05mm/px · 51 acquisitions, 13 frames shown]
[im 1/51]
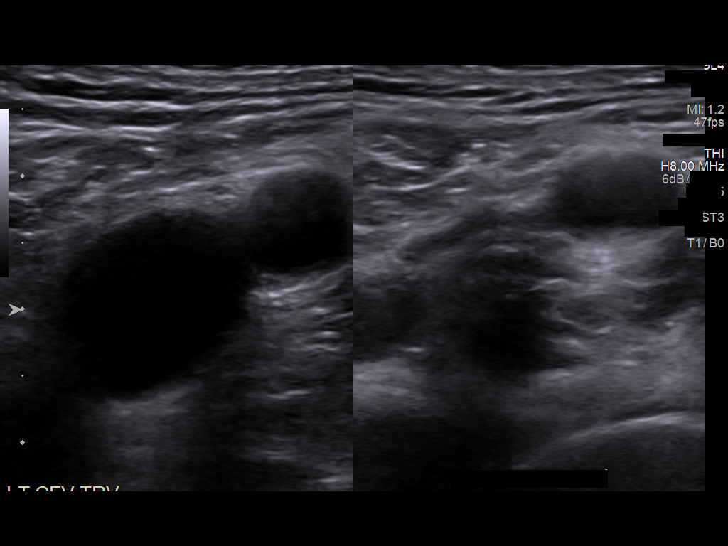
[im 5/51]
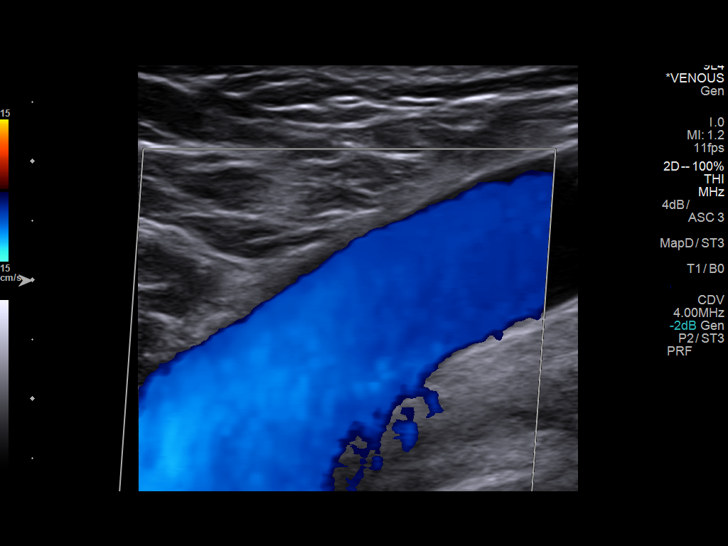
[im 9/51]
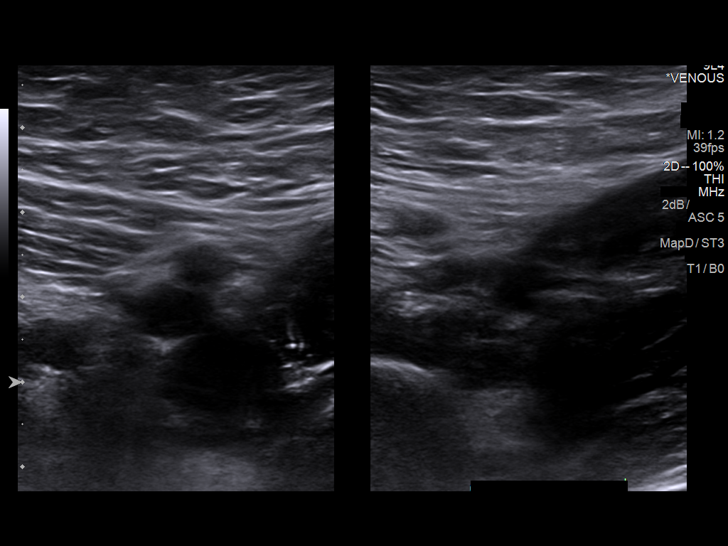
[im 14/51]
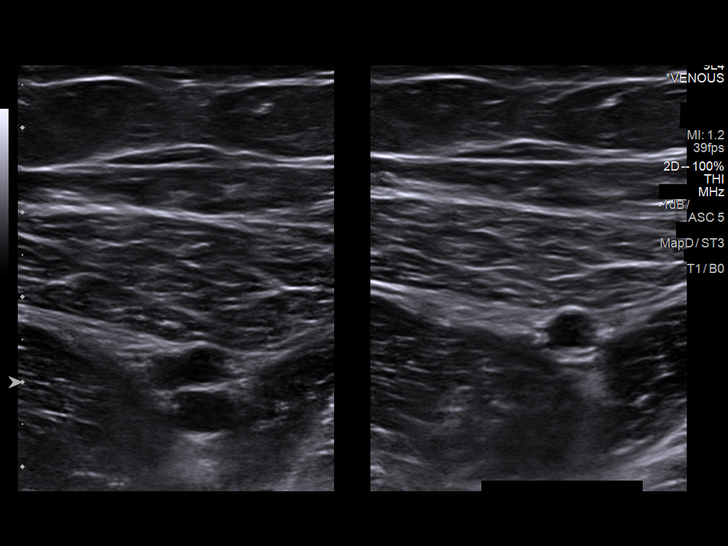
[im 18/51]
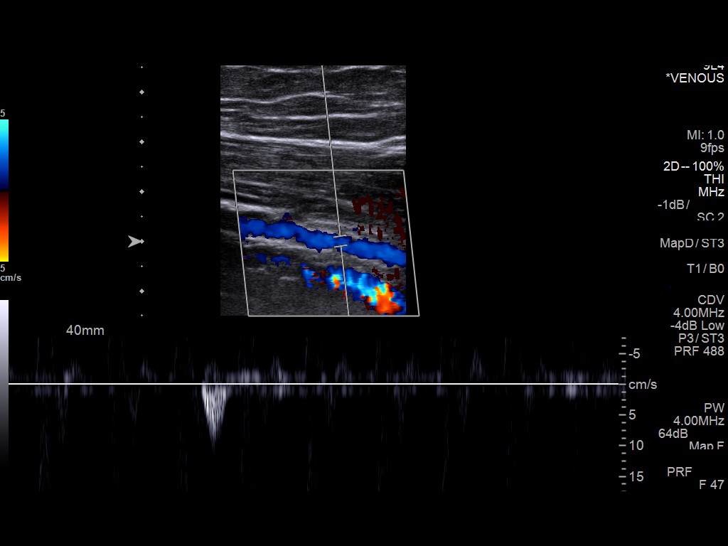
[im 22/51]
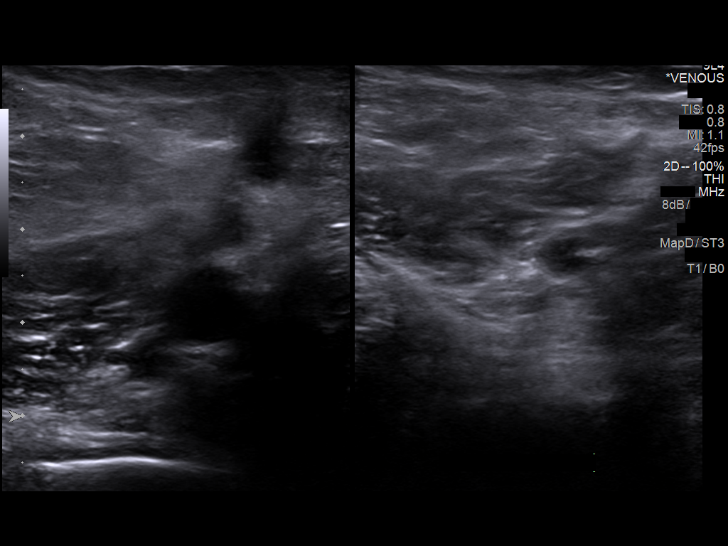
[im 29/51]
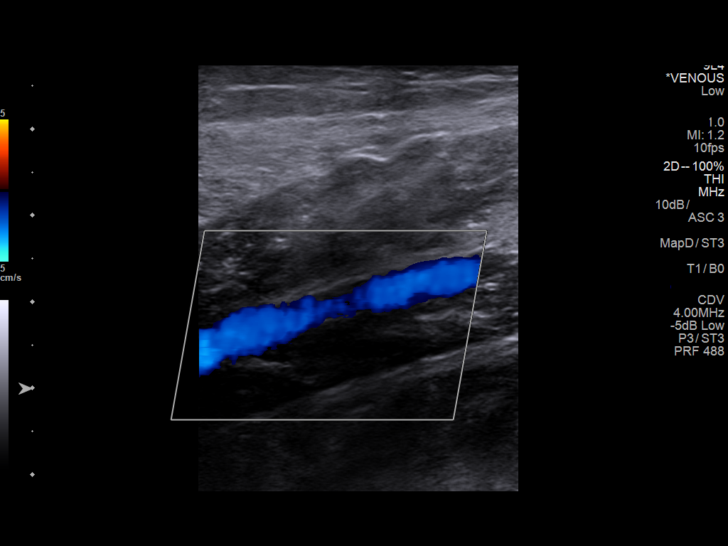
[im 31/51]
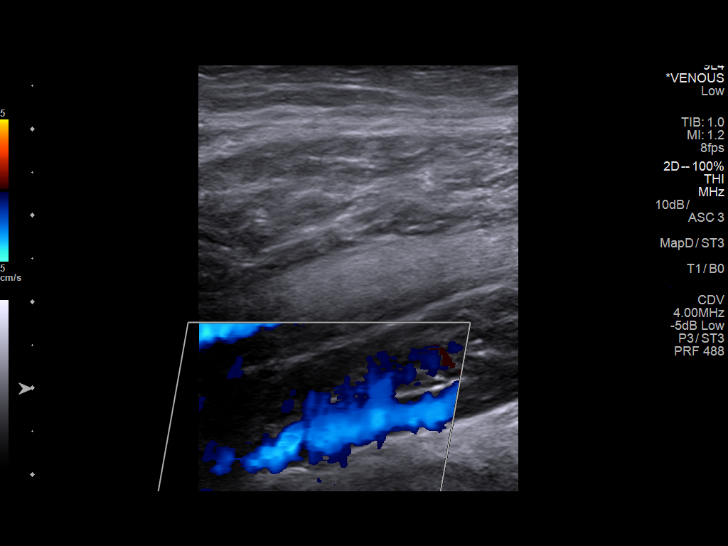
[im 35/51]
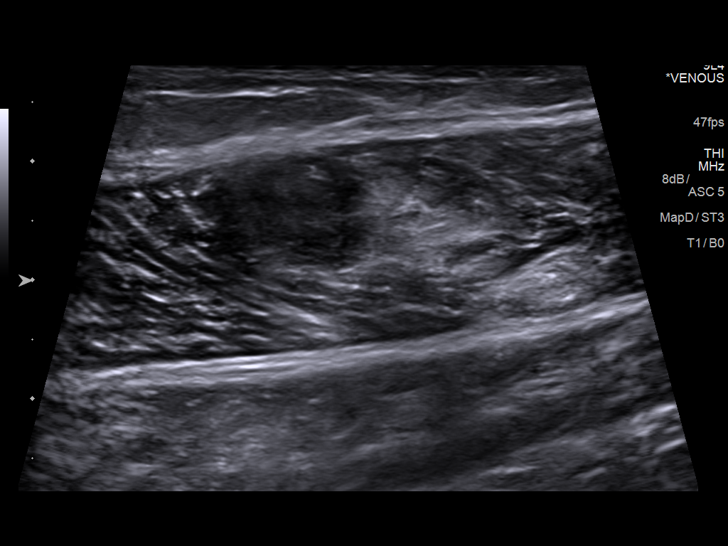
[im 40/51]
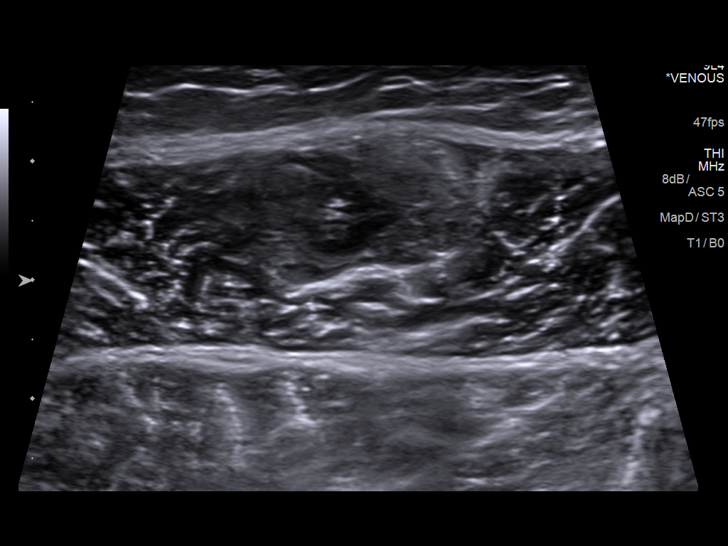
[im 44/51]
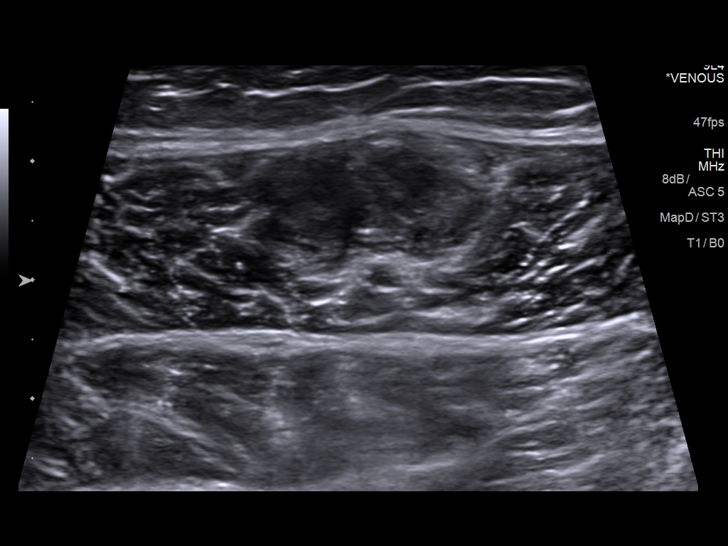
[im 46/51]
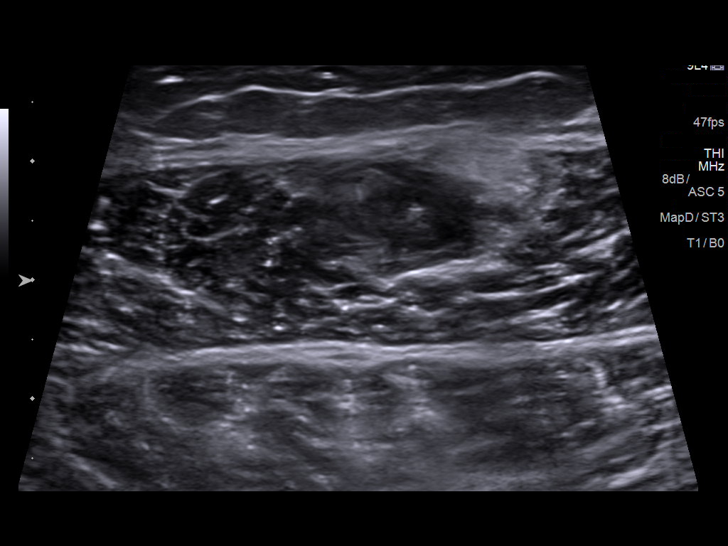
[im 51/51]
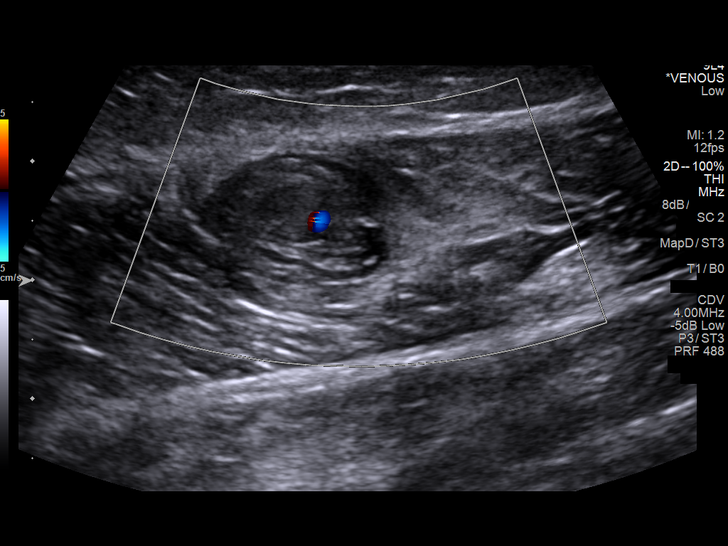

[13 of 24 positions shown; findings below may reference images not displayed]

FINDINGS: Contralateral Common Femoral Vein: Respiratory phasicity is normal
and symmetric with the symptomatic side. No evidence of thrombus.
Normal compressibility.

Common Femoral Vein: No evidence of thrombus. Normal
compressibility, respiratory phasicity and response to augmentation.

Saphenofemoral Junction: No evidence of thrombus. Normal
compressibility and flow on color Doppler imaging.

Profunda Femoral Vein: No evidence of thrombus. Normal
compressibility and flow on color Doppler imaging.

Femoral Vein: No evidence of thrombus. Normal compressibility,
respiratory phasicity and response to augmentation.

Popliteal Vein: No evidence of thrombus. Normal compressibility,
respiratory phasicity and response to augmentation.

Calf Veins: No evidence of thrombus. Normal compressibility and flow
on color Doppler imaging.

Superficial Great Saphenous Vein: No evidence of thrombus. Normal
compressibility.

Venous Reflux:  None.

Other Findings: In the region of clinical concern, in the superior
aspect of the gastrocnemius muscle there is a lobular solid soft
tissue mass measuring approximately 3.0 x 1.4 x 1.9 cm. The
echogenicity and echotexture of the nodule is similar to the
adjacent muscular tissue. There is evidence of internal vascularity
on color Doppler imaging.
IMPRESSION: 1. Negative for acute DVT.
2. However, there is a lobular 3.0 x 1.4 x 1.9 cm soft tissue mass
with internal vascularity within the superior aspect of the right
gastrocnemius musculature. This is concerning for a potential
neoplasm including soft tissue sarcoma. Recommend further evaluation
with MRI of the calf with and without gadolinium contrast.

## 2020-03-29 DIAGNOSIS — G43009 Migraine without aura, not intractable, without status migrainosus: Secondary | ICD-10-CM | POA: Diagnosis not present

## 2020-12-08 ENCOUNTER — Other Ambulatory Visit (HOSPITAL_BASED_OUTPATIENT_CLINIC_OR_DEPARTMENT_OTHER): Payer: Self-pay | Admitting: Gastroenterology

## 2020-12-08 DIAGNOSIS — R932 Abnormal findings on diagnostic imaging of liver and biliary tract: Secondary | ICD-10-CM

## 2020-12-08 DIAGNOSIS — R935 Abnormal findings on diagnostic imaging of other abdominal regions, including retroperitoneum: Secondary | ICD-10-CM

## 2020-12-11 ENCOUNTER — Other Ambulatory Visit (HOSPITAL_BASED_OUTPATIENT_CLINIC_OR_DEPARTMENT_OTHER): Payer: Medicaid Other

## 2020-12-21 ENCOUNTER — Other Ambulatory Visit: Payer: Self-pay

## 2020-12-21 ENCOUNTER — Ambulatory Visit (HOSPITAL_BASED_OUTPATIENT_CLINIC_OR_DEPARTMENT_OTHER)
Admission: RE | Admit: 2020-12-21 | Discharge: 2020-12-21 | Disposition: A | Discharge status: Home / Self Care | Payer: Medicaid Other | Source: Ambulatory Visit | Attending: Gastroenterology | Admitting: Gastroenterology

## 2020-12-21 DIAGNOSIS — R932 Abnormal findings on diagnostic imaging of liver and biliary tract: Secondary | ICD-10-CM | POA: Diagnosis present

## 2020-12-21 DIAGNOSIS — R935 Abnormal findings on diagnostic imaging of other abdominal regions, including retroperitoneum: Secondary | ICD-10-CM | POA: Diagnosis present

## 2021-08-07 ENCOUNTER — Ambulatory Visit: Payer: Self-pay | Admitting: Surgery

## 2021-09-18 ENCOUNTER — Other Ambulatory Visit: Payer: Self-pay

## 2021-09-18 ENCOUNTER — Encounter (HOSPITAL_BASED_OUTPATIENT_CLINIC_OR_DEPARTMENT_OTHER): Payer: Self-pay | Admitting: Surgery

## 2021-09-18 NOTE — Progress Notes (Signed)
Spoke w/ via phone for pre-op interview---self Lab needs dos---- UPT              Lab results------NA COVID test -----patient states asymptomatic no test needed Arrive at -------0800 09/21/21. NPO after MN NO Solid Food.  Clear liquids from MN until---0700 Med rec completed Meds DOS: none Diabetic medication -----NA Patient instructed no nail polish to be worn day of surgery Patient instructed to bring photo id and insurance card day of surgery Patient aware to have Driver (ride ) / caregiver    for 24 hours after surgery  Patient Special Instructions -----NA Pre-Op special Istructions -----NA Patient verbalized understanding of instructions that were given at this phone interview. Patient denies shortness of breath, chest pain, fever, cough at this phone interview.

## 2021-09-21 ENCOUNTER — Ambulatory Visit (HOSPITAL_BASED_OUTPATIENT_CLINIC_OR_DEPARTMENT_OTHER): Admission: RE | Admit: 2021-09-21 | Payer: BC Managed Care – PPO | Source: Home / Self Care | Admitting: Surgery

## 2021-09-21 HISTORY — DX: Anemia, unspecified: D64.9

## 2021-09-21 SURGERY — EXAM UNDER ANESTHESIA WITH HEMORRHOIDECTOMY
Anesthesia: General

## 2022-02-19 ENCOUNTER — Ambulatory Visit: Payer: Self-pay | Admitting: Surgery

## 2022-02-20 ENCOUNTER — Ambulatory Visit: Payer: Self-pay | Admitting: Surgery

## 2022-04-16 ENCOUNTER — Other Ambulatory Visit: Payer: Self-pay | Admitting: Gastroenterology

## 2022-04-16 ENCOUNTER — Other Ambulatory Visit (HOSPITAL_COMMUNITY): Payer: Self-pay | Admitting: Gastroenterology

## 2022-04-16 DIAGNOSIS — K7689 Other specified diseases of liver: Secondary | ICD-10-CM

## 2022-04-23 ENCOUNTER — Ambulatory Visit (HOSPITAL_BASED_OUTPATIENT_CLINIC_OR_DEPARTMENT_OTHER)
Admission: RE | Admit: 2022-04-23 | Discharge: 2022-04-23 | Disposition: A | Discharge status: Home / Self Care | Payer: Medicaid Other | Source: Ambulatory Visit | Attending: Gastroenterology | Admitting: Gastroenterology

## 2022-04-23 DIAGNOSIS — K7689 Other specified diseases of liver: Secondary | ICD-10-CM | POA: Insufficient documentation

## 2022-08-11 ENCOUNTER — Other Ambulatory Visit: Payer: Self-pay

## 2022-08-11 ENCOUNTER — Ambulatory Visit
Admission: EM | Admit: 2022-08-11 | Discharge: 2022-08-11 | Disposition: A | Discharge status: Home / Self Care | Payer: Medicaid Other | Attending: Family Medicine | Admitting: Family Medicine

## 2022-08-11 DIAGNOSIS — U071 COVID-19: Secondary | ICD-10-CM | POA: Insufficient documentation

## 2022-08-11 DIAGNOSIS — J302 Other seasonal allergic rhinitis: Secondary | ICD-10-CM | POA: Insufficient documentation

## 2022-08-11 DIAGNOSIS — J069 Acute upper respiratory infection, unspecified: Secondary | ICD-10-CM

## 2022-08-11 MED ORDER — AMOXICILLIN 875 MG PO TABS: ORAL_TABLET | ORAL | 0 refills | Status: DC

## 2022-08-11 MED ORDER — PREDNISONE 20 MG PO TABS: ORAL_TABLET | ORAL | 0 refills | Status: DC

## 2022-08-11 NOTE — ED Triage Notes (Signed)
Nasal congestion, cough, fatigue and body aches onset since last week Tuesday

## 2022-08-11 NOTE — Discharge Instructions (Signed)
May continue Mucinex D each morning. Take plain Mucinex (600 or'1200mg'$  extended release tabs) in the afternoon with plenty of water, for cough and congestion.  Get adequate rest.   May use Afrin nasal spray (or generic oxymetazoline) each morning for about 5 days and then discontinue.  Also recommend using saline nasal spray several times daily and saline nasal irrigation (AYR is a common brand).  Use Flonase nasal spray each morning after using Afrin nasal spray and saline nasal irrigation. Try warm salt water gargles for sore throat.  Stop all antihistamines (Zyrtec, etc) for now, and other non-prescription cough/cold preparations. May take Delsym Cough Suppressant ("12 Hour Cough Relief") at bedtime for nighttime cough.  Begin Amoxicillin if not improving about one week or if persistent fever develops.  If your COVID-19 test is positive, isolate yourself for five days from today.  At the end of five days you may end isolation if your symptoms have cleared or improved, and you have not had a fever for 24 hours. At this time you should wear a mask for five more days when you are around others.  If symptoms become significantly worse during the night or over the weekend, proceed to the local emergency room.

## 2022-08-11 NOTE — ED Provider Notes (Signed)
[name]    CSN: [id number] Arrival date & time: 08/11/22  1039      History   Chief Complaint Chief Complaint  Patient presents with   Nasal Congestion    HPI Leah Reid is a 46 y.o. female.   Six days ago patient developed nasal congestion and sneezing but did not feel ill.  She has allergic rhinitis and thought that she was having an allergy flare-up.  Two days ago she developed mild sore throat, chills, cough, myalgias, mild headache, and facial pressure.  Her chest congestion has become worse but she denies pleuritic pain or shortness of breath.  She had COVID infection in June 2022.  The history is provided by the patient.    Past Medical History:  Diagnosis Date   Anemia    Arthritis    Hypertension    Migraines    Migraines     Patient Active Problem List   Diagnosis Date Noted   Dental abscess 12/06/2016   Nausea 11/28/2016   Vertigo 11/28/2016   Drug reaction 11/28/2016   ACUTE MAXILLARY SINUSITIS 06/11/2011   ACUTE PHARYNGITIS 06/11/2011   DERMATOPHYTOSIS OF THE BODY 10/28/2008   CELLULITIS, GROIN, LEFT 10/28/2008   GILBERT'S SYNDROME 10/17/2008   HYPERBILIRUBINEMIA 10/17/2008   BENIGN NEOPLASM OF LIVER AND BILIARY PASSAGES 10/07/2008   HYPERCHOLESTEROLEMIA 10/07/2008   COMMON MIGRAINE 10/07/2008   GERD 10/07/2008   CONSTIPATION, CHRONIC 10/07/2008   OSTEOARTHRITIS, GENERALIZED, MULTIPLE JOINTS 10/07/2008   FIBROMYALGIA 10/07/2008   ABDOMINAL PAIN, CHRONIC 10/07/2008   HYPERTENSION 29/92/4268   FOLLICULITIS 34/19/6222    Past Surgical History:  Procedure Laterality Date   BREAST ENHANCEMENT SURGERY     2022   DENTAL SURGERY     TONSILLECTOMY     WISDOM TOOTH EXTRACTION      OB History   No obstetric history on file.      Home Medications    Prior to Admission medications   Medication Sig Start Date End Date Taking? Authorizing Provider  amoxicillin (AMOXIL) 875 MG tablet Take one tab PO Q12hr 08/11/22   Yes Abdulhadi Stopa, Ishmael Holter, MD  Ascorbic Acid (VITAMIN C PO) Take by mouth daily.    [provider]  B Complex Vitamins (BL VITAMIN B COMPLEX PO) Take by mouth.    [provider]  BIOTIN PO Take 1 tablet by mouth. biotin-keratin (BIOTIN PLUS KERATIN) 10,000-100 mcg-mg Tab    [provider]  calcium citrate-vitamin D (CITRACAL+D) 315-200 MG-UNIT tablet Take by mouth.    [provider]  Ferrous Sulfate (IRON PO) Take 65 mg by mouth daily.    [provider]  Flaxseed Oil (LINSEED OIL) OIL 1,400 mg by Misc.(Non-Drug; Combo Route) route.    [provider]  HYDROcodone-acetaminophen (NORCO/VICODIN) 5-325 MG tablet Take one by mouth at bedtime as needed for pain 12/01/18   Kandra Nicolas, MD  losartan (COZAAR) 50 MG tablet Take by mouth. 08/26/18   [provider]  Milk Thistle 200 MG CAPS Take by mouth.    [provider]  Multiple Vitamin (MULTIVITAMIN) tablet Take 1 tablet by mouth daily.    [provider]  norethindrone-ethinyl estradiol (CYCLAFEM,ALYACEN) 0.5/0.75/1-35 MG-MCG tablet TAKE 1 TABLET BY MOUTH EVERY DAY 05/08/18   [provider]  ondansetron (ZOFRAN ODT) 4 MG disintegrating tablet Take 1 tablet (4 mg total) by mouth every 8 (eight) hours as needed for nausea or vomiting. 01/17/19   Law, Bea Graff, [place]  MEDICATION keratin    [provider]  predniSONE (DELTASONE) 20 MG tablet Take one tab by mouth twice daily for 4 days, then one daily. Take with food. 08/11/22   Kandra Nicolas, MD  Rimegepant Sulfate (NURTEC PO) Take 75 mg by mouth every other day.    [provider]  rizatriptan (MAXALT) 10 MG tablet Take at onset of HA. May repeat in 2 hours if needed but no more than 2 in 24 hours. 05/28/18   [provider]  Vitamin D, Ergocalciferol, (DRISDOL) 1.25 MG (50000 UT) CAPS capsule Take by mouth. 01/16/15   [provider]  zinc gluconate 50 MG  tablet Take 50 mg by mouth daily.    [provider]  zonisamide (ZONEGRAN) 100 MG capsule  07/03/18   [provider]    Family History Family History  Problem Relation Age of Onset   Lung cancer Father    Hypertension Maternal Grandmother    Hyperlipidemia Maternal Grandmother    Stroke Maternal Grandmother    Diabetes Maternal Grandmother     Social History Social History   Tobacco Use   Smoking status: Never   Smokeless tobacco: Never  Vaping Use   Vaping Use: Never used  Substance Use Topics   Alcohol use: Yes    Comment: rare   Drug use: No     Allergies   Fish allergy and Metronidazole   Review of Systems Review of Systems + mild sore throat + cough + sneezing No pleuritic pain No wheezing + nasal congestion + post-nasal drainage + sinus pain/pressure No itchy/red eyes No earache No hemoptysis No SOB No fever, + chills No nausea No vomiting No abdominal pain No diarrhea No urinary symptoms No skin rash + fatigue + myalgias + headache Used OTC meds (Zyrtec and MucinexD) without relief   Physical Exam Triage Vital Signs ED Triage Vitals  Enc Vitals Group     BP 08/11/22 1309 (!) 138/97     Pulse Rate 08/11/22 1309 74     Resp 08/11/22 1309 16     Temp 08/11/22 1309 98.4 F (36.9 C)     Temp Source 08/11/22 1309 Oral     SpO2 08/11/22 1309 100 %     Weight --      Height --      Head Circumference --      Peak Flow --      Pain Score 08/11/22 1312 0     Pain Loc --      Pain Edu? --      Excl. in [place]? --    No data found.  Updated Vital Signs BP (!) 138/94 (BP Location: Left Arm)   Pulse 74   Temp 98.4 F (36.9 C) (Oral)   Resp 16   LMP  (LMP Unknown)   SpO2 100%   Visual Acuity Right Eye Distance:   Left Eye Distance:   Bilateral Distance:    Right Eye Near:   Left Eye Near:    Bilateral Near:     Physical Exam Nursing notes and Vital Signs reviewed. Appearance:  Patient appears stated age, and  in no acute distress Eyes:  Pupils are equal, round, and reactive to light and accomodation.  Extraocular movement is intact.  Conjunctivae are not inflamed  Ears:  Canals normal.  Tympanic membranes normal.  Nose:  Congested turbinates.  No sinus tenderness.  Pharynx:  Normal Neck:  Supple.  Mildly enlarged nontender bilateral cervical nodes  present.   Lungs:  Clear to auscultation.  Breath sounds are equal.  Moving air well. Heart:  Regular rate and rhythm without murmurs, rubs, or gallops.  Abdomen:  Nontender without masses or hepatosplenomegaly.  Bowel sounds are present.  No CVA or flank tenderness.  Extremities:  No edema.  Skin:  No rash present.   UC Treatments / Results  Labs (all labs ordered are listed, but only abnormal results are displayed) Labs Reviewed  SARS CORONAVIRUS 2 (TAT 6-24 HRS)    EKG   Radiology No results found.  Procedures Procedures (including critical care time)  Medications Ordered in UC Medications - No data to display  Initial Impression / Assessment and Plan / UC Course  I have reviewed the triage vital signs and the nursing notes.  Pertinent labs & imaging results that were available during my care of the patient were reviewed by me and considered in my medical decision making (see chart for details).    COVID PCR pending. Begin prednisone burst/taper. If not improving about 5 to 7 days, begin amoxicillin (Given a prescription to hold, with an expiration date)  Followup with Family Doctor if not improved in about 10 days.  Final Clinical Impressions(s) / UC Diagnoses   Final diagnoses:  Viral URI with cough  Seasonal allergic rhinitis, unspecified trigger     Discharge Instructions      May continue Mucinex D each morning. Take plain Mucinex (600 or'1200mg'$  extended release tabs) in the afternoon with plenty of water, for cough and congestion.  Get adequate rest.   May use Afrin nasal spray (or generic oxymetazoline) each morning  for about 5 days and then discontinue.  Also recommend using saline nasal spray several times daily and saline nasal irrigation (AYR is a common brand).  Use Flonase nasal spray each morning after using Afrin nasal spray and saline nasal irrigation. Try warm salt water gargles for sore throat.  Stop all antihistamines (Zyrtec, etc) for now, and other non-prescription cough/cold preparations. May take Delsym Cough Suppressant ("12 Hour Cough Relief") at bedtime for nighttime cough.  Begin Amoxicillin if not improving about one week or if persistent fever develops.  If your COVID-19 test is positive, isolate yourself for five days from today.  At the end of five days you may end isolation if your symptoms have cleared or improved, and you have not had a fever for 24 hours. At this time you should wear a mask for five more days when you are around others.  If symptoms become significantly worse during the night or over the weekend, proceed to the local emergency room.               ED Prescriptions     Medication Sig Dispense Auth. Provider   predniSONE (DELTASONE) 20 MG tablet Take one tab by mouth twice daily for 4 days, then one daily. Take with food. 12 tablet Kandra Nicolas, MD   amoxicillin (AMOXIL) 875 MG tablet Take one tab PO Q12hr 14 tablet Kandra Nicolas, MD         Kandra Nicolas, MD 08/12/22 [phone]

## 2022-08-12 ENCOUNTER — Telehealth: Payer: Self-pay

## 2022-08-12 LAB — SARS CORONAVIRUS 2 (TAT 6-24 HRS): SARS Coronavirus 2: POSITIVE — AB

## 2022-08-12 NOTE — Telephone Encounter (Signed)
TCT pt to f/u from recent visit. Pt states she is feeling better today. Pt states she has seen positive results via mychart.

## 2023-05-27 ENCOUNTER — Other Ambulatory Visit (HOSPITAL_BASED_OUTPATIENT_CLINIC_OR_DEPARTMENT_OTHER): Payer: Self-pay | Admitting: Gastroenterology

## 2023-05-27 DIAGNOSIS — K769 Liver disease, unspecified: Secondary | ICD-10-CM

## 2023-05-29 ENCOUNTER — Ambulatory Visit (HOSPITAL_BASED_OUTPATIENT_CLINIC_OR_DEPARTMENT_OTHER)
Admission: RE | Admit: 2023-05-29 | Discharge: 2023-05-29 | Disposition: A | Discharge status: Home / Self Care | Payer: Medicaid Other | Source: Ambulatory Visit | Attending: Gastroenterology | Admitting: Gastroenterology

## 2023-05-29 DIAGNOSIS — K769 Liver disease, unspecified: Secondary | ICD-10-CM | POA: Insufficient documentation

## 2023-09-28 ENCOUNTER — Ambulatory Visit
Admission: RE | Admit: 2023-09-28 | Discharge: 2023-09-28 | Disposition: A | Discharge status: Home / Self Care | Payer: Medicaid Other | Source: Ambulatory Visit | Attending: Family Medicine | Admitting: Family Medicine

## 2023-09-28 VITALS — BP 137/91 | HR 71 | Temp 98.9°F | Resp 18 | Ht 60.0 in

## 2023-09-28 DIAGNOSIS — J209 Acute bronchitis, unspecified: Secondary | ICD-10-CM

## 2023-09-28 MED ORDER — PREDNISONE 20 MG PO TABS: 40.0000 mg | ORAL_TABLET | Freq: Every day | ORAL | 0 refills | Status: AC

## 2023-09-28 MED ORDER — DOXYCYCLINE HYCLATE 100 MG PO CAPS: 100.0000 mg | ORAL_CAPSULE | Freq: Two times a day (BID) | ORAL | 0 refills | Status: AC

## 2023-09-28 NOTE — Discharge Instructions (Signed)
Take doxycycline 2 times a day.  Take this antibiotic with food Take prednisone once a day in the morning with your doxycycline.  Take daily for 5 days Drink lots of water May take Tylenol or ibuprofen for pain and fever May use Mucinex DM or Delsym for the cough See your doctor if not improving by next week

## 2023-09-28 NOTE — ED Provider Notes (Signed)
[facility]    CSN: [id number] Arrival date & time: 09/28/23  1243      History   Chief Complaint Chief Complaint  Patient presents with   Cough    Shortness of breath with productive cough - Entered by patient    HPI Leah Reid is a 48 y.o. female.   Patient states she started with a minor cough and cold about 2 weeks ago.  She has had a cough that is now persisted for 10 days.  She is coughing up some sputum.  She feels short of breath with conversation or with walking.  She is feeling tired.  No fever or chills.  No underlying asthma or lung disease.  Non-smoker.    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      Home Medications    Prior to Admission medications   Medication Sig Start Date End Date Taking? Authorizing Provider  doxycycline (VIBRAMYCIN) 100 MG capsule Take 1 capsule (100 mg total) by mouth 2 (two) times daily. 09/28/23  Yes  Eustace Moore, MD  predniSONE (DELTASONE) 20 MG tablet Take 2 tablets (40 mg total) by mouth daily with breakfast. 09/28/23  Yes Eustace Moore, MD  rizatriptan (MAXALT) 10 MG tablet Take at onset of HA. May repeat in 2 hours if needed but no more than 2 in 24 hours. 05/28/18  Yes [provider]  Ascorbic Acid (VITAMIN C PO) Take by mouth daily.    [provider]  B Complex Vitamins (BL VITAMIN B COMPLEX PO) Take by mouth.    [provider]  BIOTIN PO Take 1 tablet by mouth. biotin-keratin (BIOTIN PLUS KERATIN) 10,000-100 mcg-mg Tab    [provider]  calcium citrate-vitamin D (CITRACAL+D) 315-200 MG-UNIT tablet Take by mouth.    [provider]  Ferrous Sulfate (IRON PO) Take 65 mg by mouth daily.    [provider]  Flaxseed Oil (LINSEED OIL) OIL 1,400 mg by Misc.(Non-Drug; Combo Route) route.    [provider]  losartan (COZAAR) 50 MG tablet Take by mouth. 08/26/18   [provider]  Milk Thistle 200 MG CAPS Take by mouth.    [provider]  norethindrone-ethinyl estradiol (CYCLAFEM,ALYACEN) 0.5/0.75/1-35 MG-MCG tablet TAKE 1 TABLET BY MOUTH EVERY DAY 05/08/18   [provider]  OVER THE COUNTER MEDICATION keratin    [provider]  Vitamin D, Ergocalciferol, (DRISDOL) 1.25 MG (50000 UT) CAPS capsule Take by mouth. 01/16/15   [provider]  zinc gluconate 50 MG tablet Take 50 mg by mouth daily.    [provider]  zonisamide (ZONEGRAN) 100 MG capsule  07/03/18   [provider]    Family History Family History  Problem Relation Age of Onset   Lung cancer Father    Hypertension Maternal Grandmother    Hyperlipidemia Maternal Grandmother    Stroke Maternal Grandmother    Diabetes Maternal Grandmother     Social History Social History   Tobacco Use   Smoking status: Never   Smokeless tobacco: Never  Vaping Use   Vaping status: Never Used   Substance Use Topics   Alcohol use: Yes    Comment: rare   Drug use: No     Allergies   Fish allergy and Metronidazole   Review of Systems Review of Systems  See HPI Physical Exam Triage Vital Signs ED Triage Vitals  Encounter Vitals Group     BP 09/28/23 1304 (!) 137/91     Systolic BP Percentile --      Diastolic BP Percentile --      Pulse Rate 09/28/23 1304 71     Resp 09/28/23 1304 18     Temp 09/28/23 1304 98.9 F (37.2 C)     Temp Source 09/28/23 1304 Oral     SpO2 09/28/23 1304 97 %     Weight --      Height 09/28/23 1306 5' (1.524 m)     Head Circumference --      Peak Flow --      Pain Score 09/28/23 1306 0     Pain Loc --      Pain Education --      Exclude from Growth Chart --    No data found.  Updated Vital Signs BP (!) 137/91 (BP Location: Left Arm)   Pulse 71   Temp 98.9 F (37.2 C) (Oral)   Resp 18   Ht 5' (1.524 m)   LMP 09/14/2023   SpO2 97%   BMI 31.83 kg/m    Physical Exam Constitutional:      General: She is not in acute distress.    Appearance: She is well-developed.  HENT:     Head: Normocephalic and atraumatic.     Right Ear: Tympanic membrane and ear canal normal.     Left Ear: Tympanic membrane and ear canal normal.     Nose: Nose normal.     Mouth/Throat:     Mouth: Mucous membranes are moist.  Eyes:     Conjunctiva/sclera: Conjunctivae normal.     Pupils: Pupils are equal, round, and reactive to light.  Cardiovascular:     Rate and Rhythm: Normal rate and regular rhythm.     Heart sounds: Normal heart sounds.  Pulmonary:     Effort: Pulmonary effort is normal. No respiratory distress.     Breath sounds: Normal breath sounds.  Abdominal:     General: There is no distension.     Palpations: Abdomen is soft.  Musculoskeletal:        General: Normal range of motion.     Cervical back: Normal range of motion.  Skin:    General: Skin is warm and dry.  Neurological:     Mental Status: She is alert.      UC  Treatments / Results  Labs (all labs ordered are listed, but  only abnormal results are displayed) Labs Reviewed - No data to display  EKG   Radiology No results found.  Procedures Procedures (including critical care time)  Medications Ordered in UC Medications - No data to display  Initial Impression / Assessment and Plan / UC Course  I have reviewed the triage vital signs and the nursing notes.  Pertinent labs & imaging results that were available during my care of the patient were reviewed by me and considered in my medical decision making (see chart for details).    Will treat based on the length of her illness.  It has been over 10 days.  Trial of antibiotics appropriate Final Clinical Impressions(s) / UC Diagnoses   Final diagnoses:  Acute bronchitis, unspecified organism     Discharge Instructions      Take doxycycline 2 times a day.  Take this antibiotic with food Take prednisone once a day in the morning with your doxycycline.  Take daily for 5 days Drink lots of water May take Tylenol or ibuprofen for pain and fever May use Mucinex DM or Delsym for the cough See your doctor if not improving by next week   ED Prescriptions     Medication Sig Dispense Auth. Provider   doxycycline (VIBRAMYCIN) 100 MG capsule Take 1 capsule (100 mg total) by mouth 2 (two) times daily. 14 capsule Eustace Moore, MD   predniSONE (DELTASONE) 20 MG tablet Take 2 tablets (40 mg total) by mouth daily with breakfast. 10 tablet Eustace Moore, MD      PDMP not reviewed this encounter.   Eustace Moore, MD 09/28/23 [phone]

## 2023-09-28 NOTE — ED Triage Notes (Signed)
Patient c/o productive cough and SOB x 10 days.  Patient did have a cold several weeks back and those sx's subsided but cough/SOB has stayed.  Patient has done Robitussin.

## 2024-05-25 ENCOUNTER — Other Ambulatory Visit (HOSPITAL_COMMUNITY): Payer: Self-pay | Admitting: Gastroenterology

## 2024-05-25 DIAGNOSIS — K769 Liver disease, unspecified: Secondary | ICD-10-CM

## 2024-06-01 ENCOUNTER — Ambulatory Visit (HOSPITAL_BASED_OUTPATIENT_CLINIC_OR_DEPARTMENT_OTHER)
Admission: RE | Admit: 2024-06-01 | Discharge: 2024-06-01 | Disposition: A | Discharge status: Home / Self Care | Source: Ambulatory Visit | Attending: Gastroenterology | Admitting: Gastroenterology

## 2024-06-01 DIAGNOSIS — K769 Liver disease, unspecified: Secondary | ICD-10-CM | POA: Diagnosis present
